# Patient Record
Sex: Male | Born: 1965 | Race: Black or African American | Hispanic: No | Smoking: Current every day smoker
Health system: Southern US, Community
[De-identification: ages and names within clinical notes are randomized; demographics above are authoritative.]

## PROBLEM LIST (undated history)

## (undated) DIAGNOSIS — T7840XA Allergy, unspecified, initial encounter: Secondary | ICD-10-CM

## (undated) DIAGNOSIS — R0602 Shortness of breath: Secondary | ICD-10-CM

## (undated) DIAGNOSIS — E119 Type 2 diabetes mellitus without complications: Secondary | ICD-10-CM

## (undated) DIAGNOSIS — G4730 Sleep apnea, unspecified: Secondary | ICD-10-CM

## (undated) DIAGNOSIS — M1990 Unspecified osteoarthritis, unspecified site: Secondary | ICD-10-CM

## (undated) DIAGNOSIS — E785 Hyperlipidemia, unspecified: Secondary | ICD-10-CM

## (undated) DIAGNOSIS — I10 Essential (primary) hypertension: Secondary | ICD-10-CM

## (undated) DIAGNOSIS — H538 Other visual disturbances: Secondary | ICD-10-CM

## (undated) DIAGNOSIS — N179 Acute kidney failure, unspecified: Secondary | ICD-10-CM

## (undated) HISTORY — DX: Unspecified osteoarthritis, unspecified site: M19.90

## (undated) HISTORY — DX: Hyperlipidemia, unspecified: E78.5

## (undated) HISTORY — DX: Other visual disturbances: H53.8

## (undated) HISTORY — DX: Shortness of breath: R06.02

## (undated) HISTORY — DX: Acute kidney failure, unspecified: N17.9

## (undated) HISTORY — DX: Sleep apnea, unspecified: G47.30

## (undated) HISTORY — DX: Allergy, unspecified, initial encounter: T78.40XA

## (undated) HISTORY — DX: Type 2 diabetes mellitus without complications: E11.9

---

## 2010-06-08 ENCOUNTER — Other Ambulatory Visit: Payer: Self-pay | Admitting: Cardiology

## 2010-06-08 ENCOUNTER — Ambulatory Visit
Admission: RE | Admit: 2010-06-08 | Discharge: 2010-06-08 | Disposition: A | Discharge status: Home / Self Care | Payer: Self-pay | Source: Ambulatory Visit | Attending: Cardiology | Admitting: Cardiology

## 2010-06-08 DIAGNOSIS — I10 Essential (primary) hypertension: Secondary | ICD-10-CM

## 2013-09-22 ENCOUNTER — Other Ambulatory Visit (HOSPITAL_COMMUNITY): Payer: Self-pay | Admitting: Cardiology

## 2013-09-22 DIAGNOSIS — R079 Chest pain, unspecified: Secondary | ICD-10-CM

## 2013-09-25 ENCOUNTER — Other Ambulatory Visit: Payer: Self-pay | Admitting: Cardiology

## 2013-09-25 ENCOUNTER — Encounter (INDEPENDENT_AMBULATORY_CARE_PROVIDER_SITE_OTHER): Payer: Self-pay

## 2013-09-25 ENCOUNTER — Ambulatory Visit
Admission: RE | Admit: 2013-09-25 | Discharge: 2013-09-25 | Disposition: A | Discharge status: Home / Self Care | Payer: BC Managed Care – PPO | Source: Ambulatory Visit | Attending: Cardiology | Admitting: Cardiology

## 2013-09-25 DIAGNOSIS — M545 Low back pain, unspecified: Secondary | ICD-10-CM

## 2013-10-05 ENCOUNTER — Encounter (HOSPITAL_COMMUNITY)
Admission: RE | Admit: 2013-10-05 | Discharge: 2013-10-05 | Disposition: A | Discharge status: Home / Self Care | Payer: BC Managed Care – PPO | Source: Ambulatory Visit | Attending: Cardiology | Admitting: Cardiology

## 2013-10-05 ENCOUNTER — Other Ambulatory Visit: Payer: Self-pay

## 2013-10-05 DIAGNOSIS — R079 Chest pain, unspecified: Secondary | ICD-10-CM | POA: Insufficient documentation

## 2013-10-05 MED ORDER — TECHNETIUM TC 99M SESTAMIBI - CARDIOLITE: 30.0000 | Freq: Once | INTRAVENOUS | Status: AC | PRN

## 2013-10-05 MED ORDER — TECHNETIUM TC 99M SESTAMIBI GENERIC - CARDIOLITE
30.0000 | Freq: Once | INTRAVENOUS | Status: AC | PRN
  Administered 2013-10-05: 30 via INTRAVENOUS

## 2013-10-05 MED ORDER — TECHNETIUM TC 99M SESTAMIBI GENERIC - CARDIOLITE
10.0000 | Freq: Once | INTRAVENOUS | Status: AC | PRN
  Administered 2013-10-05: 10 via INTRAVENOUS

## 2013-10-07 ENCOUNTER — Ambulatory Visit (HOSPITAL_COMMUNITY): Payer: BC Managed Care – PPO

## 2013-10-07 ENCOUNTER — Encounter (HOSPITAL_COMMUNITY): Payer: BC Managed Care – PPO

## 2014-11-11 ENCOUNTER — Encounter (HOSPITAL_COMMUNITY): Payer: Self-pay | Admitting: Emergency Medicine

## 2014-11-11 ENCOUNTER — Emergency Department (HOSPITAL_COMMUNITY)
Admission: EM | Admit: 2014-11-11 | Discharge: 2014-11-11 | Disposition: A | Discharge status: Home / Self Care | Payer: BC Managed Care – PPO | Attending: Emergency Medicine | Admitting: Emergency Medicine

## 2014-11-11 DIAGNOSIS — Z720 Tobacco use: Secondary | ICD-10-CM | POA: Diagnosis not present

## 2014-11-11 DIAGNOSIS — Y998 Other external cause status: Secondary | ICD-10-CM | POA: Insufficient documentation

## 2014-11-11 DIAGNOSIS — Y9289 Other specified places as the place of occurrence of the external cause: Secondary | ICD-10-CM | POA: Insufficient documentation

## 2014-11-11 DIAGNOSIS — Y9389 Activity, other specified: Secondary | ICD-10-CM | POA: Insufficient documentation

## 2014-11-11 DIAGNOSIS — T63461A Toxic effect of venom of wasps, accidental (unintentional), initial encounter: Secondary | ICD-10-CM | POA: Diagnosis present

## 2014-11-11 MED ORDER — DIPHENHYDRAMINE HCL 25 MG PO TABS: 50.0000 mg | ORAL_TABLET | Freq: Four times a day (QID) | ORAL | Status: DC | PRN

## 2014-11-11 MED ORDER — FAMOTIDINE 20 MG PO TABS: 20.0000 mg | ORAL_TABLET | Freq: Every day | ORAL | Status: DC

## 2014-11-11 MED ORDER — EPINEPHRINE 0.3 MG/0.3ML IJ SOAJ: 0.3000 mg | Freq: Once | INTRAMUSCULAR | Status: DC

## 2014-11-11 MED ORDER — DIPHENHYDRAMINE HCL 25 MG PO CAPS
50.0000 mg | ORAL_CAPSULE | Freq: Once | ORAL | Status: AC
  Administered 2014-11-11: 50 mg via ORAL
  Filled 2014-11-11: qty 2

## 2014-11-11 MED ORDER — FAMOTIDINE 20 MG PO TABS
40.0000 mg | ORAL_TABLET | Freq: Once | ORAL | Status: AC
  Administered 2014-11-11: 40 mg via ORAL
  Filled 2014-11-11: qty 2

## 2014-11-11 MED ORDER — PREDNISONE 20 MG PO TABS: 60.0000 mg | ORAL_TABLET | Freq: Every day | ORAL | Status: DC

## 2014-11-11 MED ORDER — PREDNISONE 20 MG PO TABS
60.0000 mg | ORAL_TABLET | Freq: Once | ORAL | Status: AC
  Administered 2014-11-11: 60 mg via ORAL
  Filled 2014-11-11: qty 3

## 2014-11-11 NOTE — Discharge Instructions (Signed)
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
## 2015-01-13 ENCOUNTER — Emergency Department (HOSPITAL_COMMUNITY): Payer: Worker's Compensation

## 2015-01-13 ENCOUNTER — Emergency Department (HOSPITAL_COMMUNITY)
Admission: EM | Admit: 2015-01-13 | Discharge: 2015-01-13 | Disposition: A | Discharge status: Home / Self Care | Payer: Worker's Compensation | Attending: Emergency Medicine | Admitting: Emergency Medicine

## 2015-01-13 ENCOUNTER — Encounter (HOSPITAL_COMMUNITY): Payer: Self-pay | Admitting: *Deleted

## 2015-01-13 DIAGNOSIS — Y9389 Activity, other specified: Secondary | ICD-10-CM | POA: Insufficient documentation

## 2015-01-13 DIAGNOSIS — Y998 Other external cause status: Secondary | ICD-10-CM | POA: Insufficient documentation

## 2015-01-13 DIAGNOSIS — S99922A Unspecified injury of left foot, initial encounter: Secondary | ICD-10-CM | POA: Insufficient documentation

## 2015-01-13 DIAGNOSIS — Y92219 Unspecified school as the place of occurrence of the external cause: Secondary | ICD-10-CM | POA: Insufficient documentation

## 2015-01-13 DIAGNOSIS — Z720 Tobacco use: Secondary | ICD-10-CM | POA: Diagnosis not present

## 2015-01-13 DIAGNOSIS — Z7952 Long term (current) use of systemic steroids: Secondary | ICD-10-CM | POA: Insufficient documentation

## 2015-01-13 MED ORDER — NAPROXEN 500 MG PO TABS: 500.0000 mg | ORAL_TABLET | Freq: Two times a day (BID) | ORAL | Status: DC

## 2015-01-13 MED ORDER — ELVITEG-COBIC-EMTRICIT-TENOFAF 150-150-200-10 MG PO TABS: 1.0000 | ORAL_TABLET | Freq: Every day | ORAL | Status: DC

## 2015-01-13 MED ORDER — HYDROCODONE-ACETAMINOPHEN 5-325 MG PO TABS
2.0000 | ORAL_TABLET | Freq: Once | ORAL | Status: AC
  Administered 2015-01-13: 2 via ORAL
  Filled 2015-01-13: qty 2

## 2015-01-13 NOTE — ED Notes (Signed)
Pt in c/o injury to his left foot, states it happened at work today around 10 am, increased pain and swelling since that time

## 2015-01-13 NOTE — ED Notes (Signed)
Patient transported to X-ray 
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
## 2015-01-13 NOTE — Discharge Instructions (Signed)
Rest. Ice. Compress. Elevate. Follow-up with [facility] in one week if no improvement. Return for numbness or tingling in the foot. Use crutches and ankle splint until you no longer have pain.

## 2015-01-31 ENCOUNTER — Encounter (HOSPITAL_COMMUNITY): Payer: Self-pay | Admitting: Emergency Medicine

## 2015-01-31 ENCOUNTER — Emergency Department (HOSPITAL_COMMUNITY)
Admission: EM | Admit: 2015-01-31 | Discharge: 2015-01-31 | Disposition: A | Discharge status: Home / Self Care | Payer: BC Managed Care – PPO | Attending: Emergency Medicine | Admitting: Emergency Medicine

## 2015-01-31 DIAGNOSIS — Z720 Tobacco use: Secondary | ICD-10-CM | POA: Diagnosis not present

## 2015-01-31 DIAGNOSIS — M5441 Lumbago with sciatica, right side: Secondary | ICD-10-CM | POA: Diagnosis not present

## 2015-01-31 DIAGNOSIS — Z791 Long term (current) use of non-steroidal anti-inflammatories (NSAID): Secondary | ICD-10-CM | POA: Insufficient documentation

## 2015-01-31 DIAGNOSIS — Z7952 Long term (current) use of systemic steroids: Secondary | ICD-10-CM | POA: Insufficient documentation

## 2015-01-31 DIAGNOSIS — Z79899 Other long term (current) drug therapy: Secondary | ICD-10-CM | POA: Insufficient documentation

## 2015-01-31 DIAGNOSIS — M545 Low back pain: Secondary | ICD-10-CM | POA: Diagnosis present

## 2015-01-31 MED ORDER — NAPROXEN 500 MG PO TABS: 500.0000 mg | ORAL_TABLET | Freq: Two times a day (BID) | ORAL | Status: DC

## 2015-01-31 MED ORDER — TRAMADOL HCL 50 MG PO TABS: 50.0000 mg | ORAL_TABLET | Freq: Four times a day (QID) | ORAL | Status: DC | PRN

## 2015-01-31 MED ORDER — METHOCARBAMOL 500 MG PO TABS: 500.0000 mg | ORAL_TABLET | Freq: Two times a day (BID) | ORAL | Status: DC

## 2015-01-31 NOTE — Discharge Instructions (Signed)
Take pain medication (Ultram) and muscle relaxer (Robaxin) as prescribed for severe pain.  Do not drive or operate heavy machinery for 4-6 hours after taking medication.
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
## 2015-01-31 NOTE — ED Notes (Signed)
Pt sts intermittent lower back with radiation down right

## 2015-02-11 ENCOUNTER — Inpatient Hospital Stay: Payer: Self-pay | Admitting: Family Medicine

## 2015-08-02 IMAGING — CR DG LUMBAR SPINE COMPLETE 4+V
5 series · 5 of 5 positions shown · non-contrast
Comparison: Chest radiographs 06/08/2010.

CLINICAL DATA: 47-year-old male low back pain radiating to the
right lower extremity. Initial encounter.

EXAM:
LUMBAR SPINE - COMPLETE 4+ VIEW

[t l-spine a.p.]
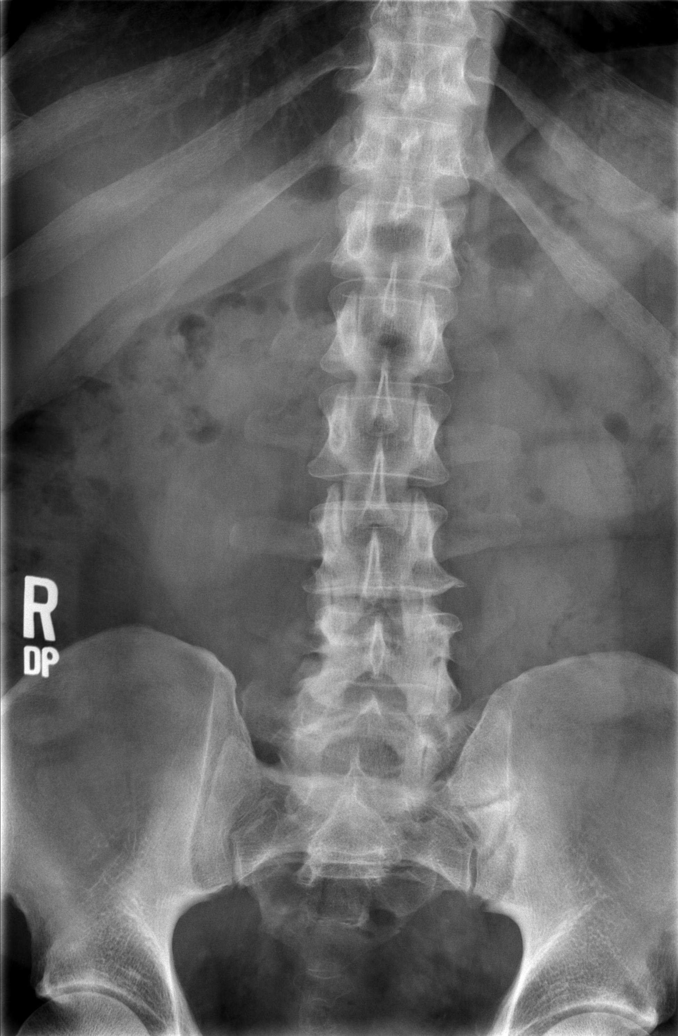

[t l-spine oblique exposure (1 of 2)]
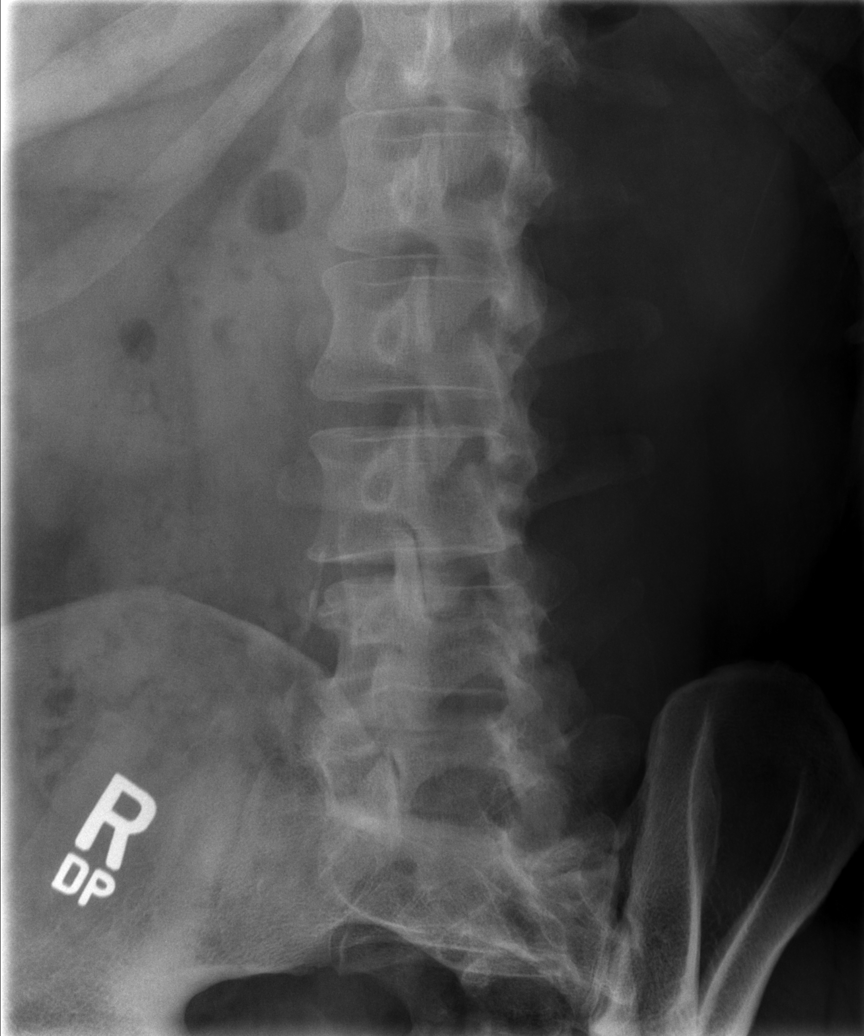

[t l-spine oblique exposure (2 of 2)]
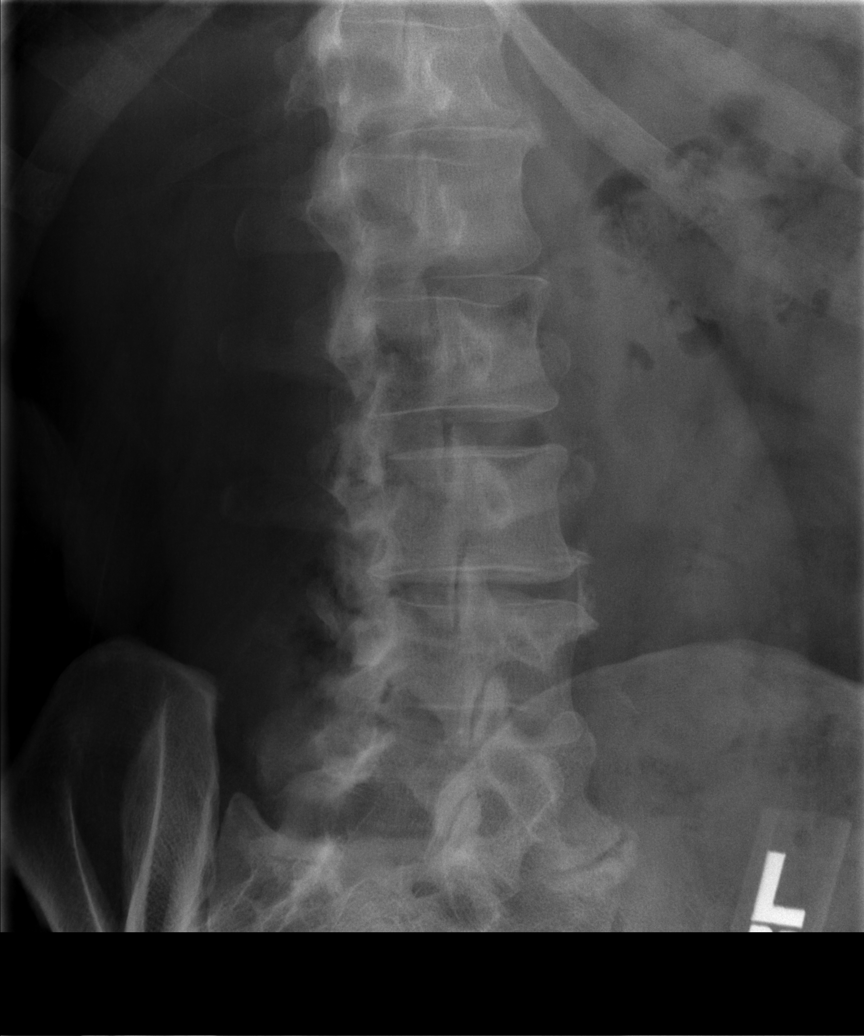

[t l-spine lat]
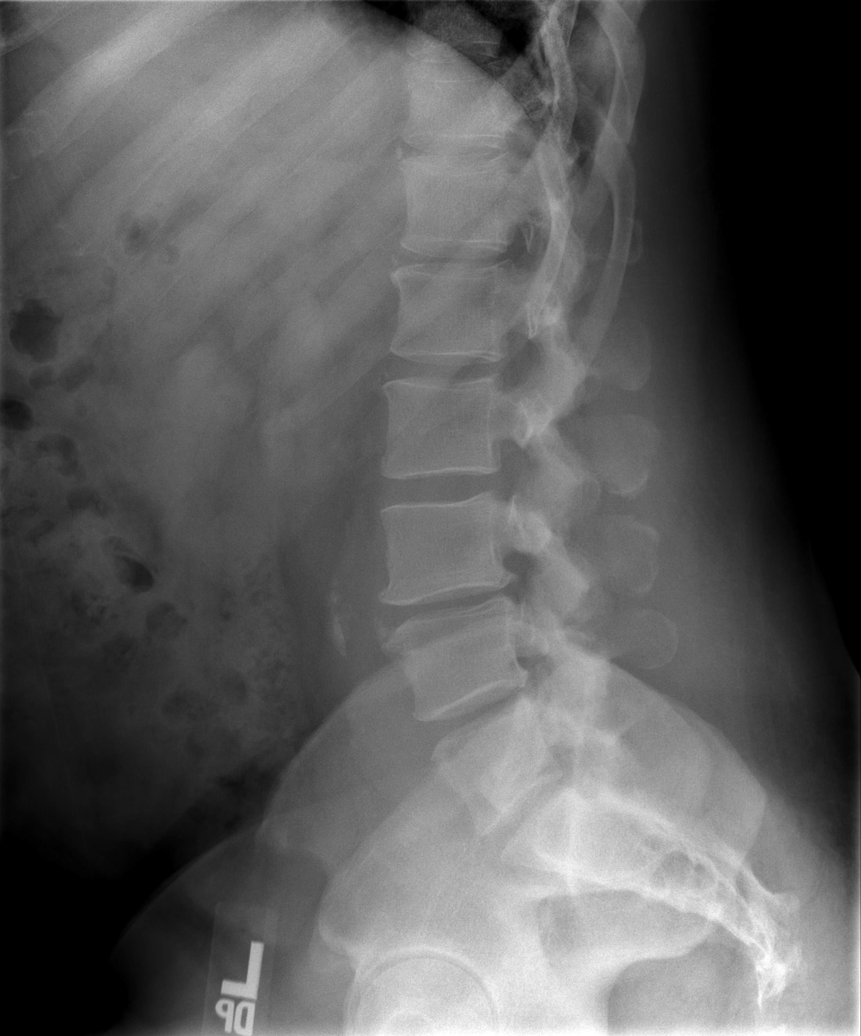

[t l-spine l5-s1 spot]
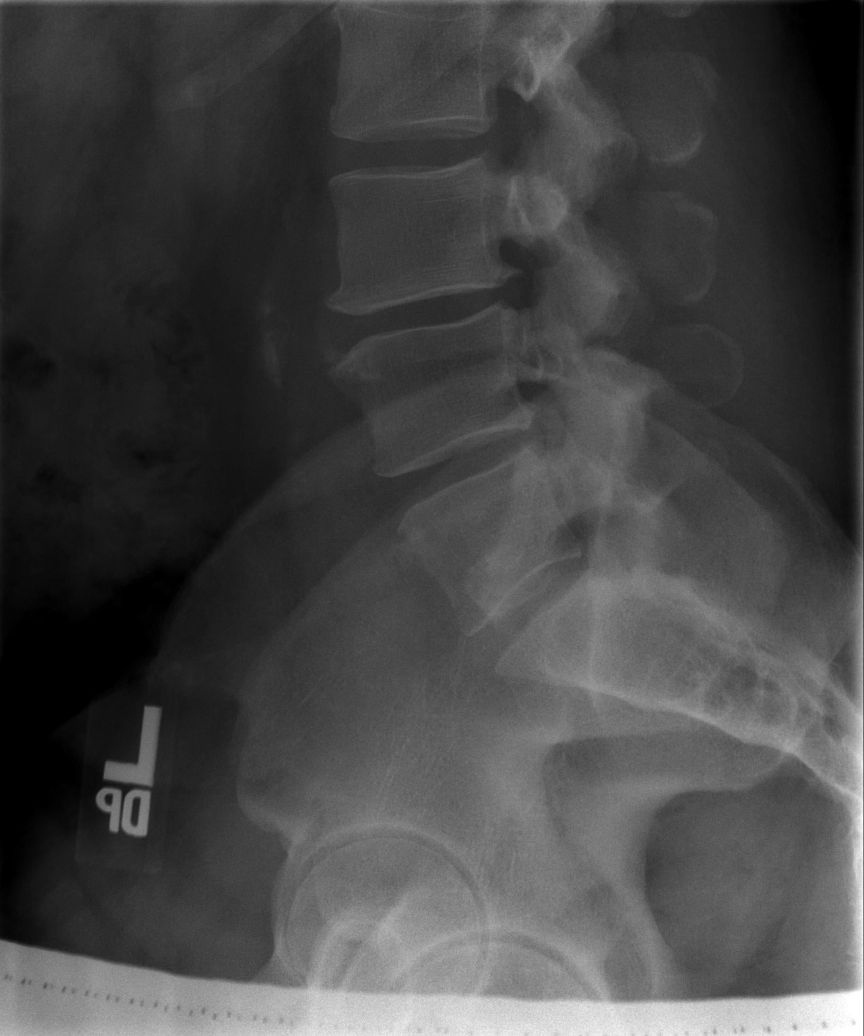

[5 of 5 positions shown; findings below may reference images not displayed]

FINDINGS: Twelve full size pairs of ribs on the comparison. Therefore, normal
lumbar segmentation except for vestigial ribs at L1, and with a
lumbarized S1 level. Sclerotic left S1-S2 assimilation joint. Normal
vertebral height and alignment. Disc space loss and endplate
spurring at L4-L5, and to a lesser extent L5-S1. Mild facet
hypertrophy bilaterally at each of these levels. Other disc spaces
are relatively preserved. Mild Aortoiliac calcified atherosclerosis
noted. Visualized lower thoracic levels appear intact. Sacral ala
and SI joints within normal limits.
IMPRESSION: 1. Transitional anatomy, with lumbarized S1 level.
2. Chronic disc, endplate, and facet degeneration at L4-L5 and
L5-S1. Mildly sclerotic left S1-S2 assimilation joint.

## 2016-07-09 ENCOUNTER — Encounter (HOSPITAL_COMMUNITY): Payer: Self-pay | Admitting: Emergency Medicine

## 2016-07-09 ENCOUNTER — Emergency Department (HOSPITAL_COMMUNITY): Payer: BC Managed Care – PPO

## 2016-07-09 ENCOUNTER — Observation Stay (HOSPITAL_COMMUNITY)
Admission: EM | Admit: 2016-07-09 | Discharge: 2016-07-10 | Disposition: A | Discharge status: Home / Self Care | Payer: BC Managed Care – PPO | Attending: Nephrology | Admitting: Nephrology

## 2016-07-09 DIAGNOSIS — F17200 Nicotine dependence, unspecified, uncomplicated: Secondary | ICD-10-CM | POA: Diagnosis not present

## 2016-07-09 DIAGNOSIS — E871 Hypo-osmolality and hyponatremia: Secondary | ICD-10-CM | POA: Diagnosis not present

## 2016-07-09 DIAGNOSIS — H538 Other visual disturbances: Secondary | ICD-10-CM

## 2016-07-09 DIAGNOSIS — Z791 Long term (current) use of non-steroidal anti-inflammatories (NSAID): Secondary | ICD-10-CM | POA: Diagnosis not present

## 2016-07-09 DIAGNOSIS — I10 Essential (primary) hypertension: Secondary | ICD-10-CM

## 2016-07-09 DIAGNOSIS — R739 Hyperglycemia, unspecified: Secondary | ICD-10-CM

## 2016-07-09 DIAGNOSIS — N179 Acute kidney failure, unspecified: Secondary | ICD-10-CM

## 2016-07-09 DIAGNOSIS — E119 Type 2 diabetes mellitus without complications: Secondary | ICD-10-CM

## 2016-07-09 DIAGNOSIS — Z7952 Long term (current) use of systemic steroids: Secondary | ICD-10-CM | POA: Insufficient documentation

## 2016-07-09 DIAGNOSIS — Z79899 Other long term (current) drug therapy: Secondary | ICD-10-CM | POA: Diagnosis not present

## 2016-07-09 DIAGNOSIS — E1165 Type 2 diabetes mellitus with hyperglycemia: Secondary | ICD-10-CM | POA: Diagnosis not present

## 2016-07-09 DIAGNOSIS — B370 Candidal stomatitis: Secondary | ICD-10-CM

## 2016-07-09 DIAGNOSIS — J029 Acute pharyngitis, unspecified: Secondary | ICD-10-CM | POA: Diagnosis present

## 2016-07-09 DIAGNOSIS — E1159 Type 2 diabetes mellitus with other circulatory complications: Secondary | ICD-10-CM | POA: Insufficient documentation

## 2016-07-09 HISTORY — DX: Acute kidney failure, unspecified: N17.9

## 2016-07-09 HISTORY — DX: Candidal stomatitis: B37.0

## 2016-07-09 HISTORY — DX: Other visual disturbances: H53.8

## 2016-07-09 LAB — CBC WITH DIFFERENTIAL/PLATELET
BASOS ABS: 0 10*3/uL (ref 0.0–0.1)
BASOS PCT: 0 %
EOS ABS: 0.1 10*3/uL (ref 0.0–0.7)
EOS PCT: 1 %
HCT: 44.9 % (ref 39.0–52.0)
Hemoglobin: 15.7 g/dL (ref 13.0–17.0)
Lymphocytes Relative: 22 %
Lymphs Abs: 2.7 10*3/uL (ref 0.7–4.0)
MCH: 29 pg (ref 26.0–34.0)
MCHC: 35 g/dL (ref 30.0–36.0)
MCV: 82.8 fL (ref 78.0–100.0)
Monocytes Absolute: 0.8 10*3/uL (ref 0.1–1.0)
Monocytes Relative: 6 %
Neutro Abs: 8.8 10*3/uL — ABNORMAL HIGH (ref 1.7–7.7)
Neutrophils Relative %: 71 %
PLATELETS: 208 10*3/uL (ref 150–400)
RBC: 5.42 MIL/uL (ref 4.22–5.81)
RDW: 12.4 % (ref 11.5–15.5)
WBC: 12.4 10*3/uL — AB (ref 4.0–10.5)

## 2016-07-09 LAB — URINALYSIS, ROUTINE W REFLEX MICROSCOPIC
Bacteria, UA: NONE SEEN
Bilirubin Urine: NEGATIVE
HGB URINE DIPSTICK: NEGATIVE
Ketones, ur: NEGATIVE mg/dL
Leukocytes, UA: NEGATIVE
Nitrite: NEGATIVE
PROTEIN: NEGATIVE mg/dL
Specific Gravity, Urine: 1.023 (ref 1.005–1.030)
Squamous Epithelial / LPF: NONE SEEN
pH: 7 (ref 5.0–8.0)

## 2016-07-09 LAB — CREATININE, SERUM
Creatinine, Ser: 1.28 mg/dL — ABNORMAL HIGH (ref 0.61–1.24)
GFR calc Af Amer: >60 mL/min (ref >60)

## 2016-07-09 LAB — I-STAT VENOUS BLOOD GAS, ED
ACID-BASE EXCESS: 4 mmol/L — AB (ref 0.0–2.0)
Bicarbonate: 30.2 mmol/L — ABNORMAL HIGH (ref 20.0–28.0)
O2 SAT: 58 %
PO2 VEN: 31 mmHg — AB (ref 32.0–45.0)
TCO2: 32 mmol/L (ref 0–100)
pCO2, Ven: 48 mmHg (ref 44.0–60.0)
pH, Ven: 7.406 (ref 7.250–7.430)

## 2016-07-09 LAB — COMPREHENSIVE METABOLIC PANEL
ALT: 21 U/L (ref 17–63)
AST: 16 U/L (ref 15–41)
Albumin: 3.5 g/dL (ref 3.5–5.0)
Alkaline Phosphatase: 102 U/L (ref 38–126)
Anion gap: 12 (ref 5–15)
BILIRUBIN TOTAL: 0.4 mg/dL (ref 0.3–1.2)
BUN: 14 mg/dL (ref 6–20)
CALCIUM: 9.1 mg/dL (ref 8.9–10.3)
CO2: 25 mmol/L (ref 22–32)
CREATININE: 1.33 mg/dL — AB (ref 0.61–1.24)
Chloride: 94 mmol/L — ABNORMAL LOW (ref 101–111)
GFR calc Af Amer: >60 mL/min (ref >60)
Glucose, Bld: 724 mg/dL (ref 65–99)
Potassium: 4.4 mmol/L (ref 3.5–5.1)
Sodium: 131 mmol/L — ABNORMAL LOW (ref 135–145)
TOTAL PROTEIN: 7.2 g/dL (ref 6.5–8.1)

## 2016-07-09 LAB — CBC
HEMATOCRIT: 44.4 % (ref 39.0–52.0)
Hemoglobin: 15.4 g/dL (ref 13.0–17.0)
MCH: 28.5 pg (ref 26.0–34.0)
MCHC: 34.7 g/dL (ref 30.0–36.0)
MCV: 82.2 fL (ref 78.0–100.0)
Platelets: 198 10*3/uL (ref 150–400)
RBC: 5.4 MIL/uL (ref 4.22–5.81)
RDW: 12.5 % (ref 11.5–15.5)
WBC: 12.6 10*3/uL — ABNORMAL HIGH (ref 4.0–10.5)

## 2016-07-09 LAB — RAPID STREP SCREEN (MED CTR MEBANE ONLY): Streptococcus, Group A Screen (Direct): NEGATIVE

## 2016-07-09 LAB — CBG MONITORING, ED
GLUCOSE-CAPILLARY: 539 mg/dL — AB (ref 65–99)
Glucose-Capillary: >600 mg/dL (ref 65–99)

## 2016-07-09 LAB — GLUCOSE, CAPILLARY
Glucose-Capillary: 384 mg/dL — ABNORMAL HIGH (ref 65–99)
Glucose-Capillary: 418 mg/dL — ABNORMAL HIGH (ref 65–99)

## 2016-07-09 MED ORDER — HEPARIN SODIUM (PORCINE) 5000 UNIT/ML IJ SOLN
5000.0000 [IU] | Freq: Three times a day (TID) | INTRAMUSCULAR | Status: DC
  Administered 2016-07-09 – 2016-07-10 (×2): 5000 [IU] via SUBCUTANEOUS
  Filled 2016-07-09 (×2): qty 1

## 2016-07-09 MED ORDER — INSULIN ASPART 100 UNIT/ML ~~LOC~~ SOLN
0.0000 [IU] | SUBCUTANEOUS | Status: DC
  Administered 2016-07-10 (×2): 3 [IU] via SUBCUTANEOUS
  Administered 2016-07-10: 5 [IU] via SUBCUTANEOUS
  Administered 2016-07-10: 9 [IU] via SUBCUTANEOUS

## 2016-07-09 MED ORDER — HYDRALAZINE HCL 20 MG/ML IJ SOLN: 5.0000 mg | INTRAMUSCULAR | Status: DC | PRN

## 2016-07-09 MED ORDER — HYDROCODONE-ACETAMINOPHEN 5-325 MG PO TABS: 1.0000 | ORAL_TABLET | ORAL | Status: DC | PRN

## 2016-07-09 MED ORDER — POLYVINYL ALCOHOL 1.4 % OP SOLN
2.0000 [drp] | Freq: Three times a day (TID) | OPHTHALMIC | Status: DC
  Administered 2016-07-09 – 2016-07-10 (×2): 2 [drp] via OPHTHALMIC
  Filled 2016-07-09: qty 15

## 2016-07-09 MED ORDER — SODIUM CHLORIDE 0.9 % IV BOLUS (SEPSIS)
1000.0000 mL | Freq: Once | INTRAVENOUS | Status: AC
  Administered 2016-07-09: 1000 mL via INTRAVENOUS

## 2016-07-09 MED ORDER — FLUCONAZOLE 100 MG PO TABS
150.0000 mg | ORAL_TABLET | Freq: Once | ORAL | Status: AC
  Administered 2016-07-09: 150 mg via ORAL
  Filled 2016-07-09: qty 2

## 2016-07-09 MED ORDER — MAGIC MOUTHWASH W/LIDOCAINE
5.0000 mL | Freq: Three times a day (TID) | ORAL | Status: DC
  Administered 2016-07-09 – 2016-07-10 (×3): 5 mL via ORAL
  Filled 2016-07-09 (×3): qty 5

## 2016-07-09 MED ORDER — TRAZODONE HCL 50 MG PO TABS: 50.0000 mg | ORAL_TABLET | Freq: Every evening | ORAL | Status: DC | PRN

## 2016-07-09 MED ORDER — ACETAMINOPHEN 325 MG PO TABS
650.0000 mg | ORAL_TABLET | Freq: Four times a day (QID) | ORAL | Status: DC | PRN
  Administered 2016-07-10: 650 mg via ORAL
  Filled 2016-07-09: qty 2

## 2016-07-09 MED ORDER — ONDANSETRON HCL 4 MG PO TABS: 4.0000 mg | ORAL_TABLET | Freq: Four times a day (QID) | ORAL | Status: DC | PRN

## 2016-07-09 MED ORDER — ASPIRIN EC 81 MG PO TBEC
81.0000 mg | DELAYED_RELEASE_TABLET | Freq: Every day | ORAL | Status: DC
  Administered 2016-07-09 – 2016-07-10 (×2): 81 mg via ORAL
  Filled 2016-07-09 (×2): qty 1

## 2016-07-09 MED ORDER — INSULIN ASPART 100 UNIT/ML ~~LOC~~ SOLN: 0.0000 [IU] | Freq: Three times a day (TID) | SUBCUTANEOUS | Status: DC

## 2016-07-09 MED ORDER — SODIUM CHLORIDE 0.9 % IV SOLN
INTRAVENOUS | Status: AC
Start: 2016-07-09 — End: 2016-07-10
  Administered 2016-07-09: 1000 mL via INTRAVENOUS
  Administered 2016-07-10: 04:00:00 via INTRAVENOUS

## 2016-07-09 MED ORDER — INSULIN ASPART 100 UNIT/ML ~~LOC~~ SOLN
12.0000 [IU] | Freq: Once | SUBCUTANEOUS | Status: AC
  Administered 2016-07-09: 12 [IU] via SUBCUTANEOUS

## 2016-07-09 MED ORDER — LIVING WELL WITH DIABETES BOOK
Freq: Once | Status: AC
  Administered 2016-07-09: 21:00:00
  Filled 2016-07-09: qty 1

## 2016-07-09 MED ORDER — INSULIN ASPART 100 UNIT/ML ~~LOC~~ SOLN
10.0000 [IU] | Freq: Once | SUBCUTANEOUS | Status: AC
  Administered 2016-07-09: 10 [IU] via INTRAVENOUS
  Filled 2016-07-09: qty 1

## 2016-07-09 MED ORDER — ONDANSETRON HCL 4 MG/2ML IJ SOLN: 4.0000 mg | Freq: Four times a day (QID) | INTRAMUSCULAR | Status: DC | PRN

## 2016-07-09 MED ORDER — POLYETHYLENE GLYCOL 3350 17 G PO PACK: 17.0000 g | PACK | Freq: Every day | ORAL | Status: DC | PRN

## 2016-07-09 MED ORDER — ACETAMINOPHEN 650 MG RE SUPP: 650.0000 mg | Freq: Four times a day (QID) | RECTAL | Status: DC | PRN

## 2016-07-09 NOTE — ED Notes (Signed)
Checked patient cbg it read over 600 notified PA of blood sugar

## 2016-07-09 NOTE — ED Notes (Signed)
Checked patient eyes 20/20 both eyes and left eye 20/160 and right patient was 20/80

## 2016-07-09 NOTE — H&P (Addendum)
History and Physical    Marco Campbell QIH:[id number] DOB: Apr 05, 1966 DOA: 07/09/2016  PCP: Patricia Nettle, MD   Patient coming from: home  Chief Complaint: sore throat, HA, blurry vision   HPI: Marco Campbell is a 51 y.o. male with no significant previous medical history who presented to the ED with c/o sore throat, HA, blurry vision that has been going on for almost two weeks Patient reports that his last physical exam was years ago and he considers himself healthy, never been diagnosed with any illnesses. During the last (been having polydipsia and polyuria and the rest of the symptoms he developed over the period of last couple of weeks but blurry lesion was present for a while intermittently  ED Course: On admission in the emergency room his vital signs initially revealed tachycardia with heart rate 119 bpm, and elevated later to 163/86, needed further refills count-12,400, glucose was elevated to 724 mg/dL, creatinine 1.33, sodium 131 Chest x-ray didn't reveal any active cardiopulmonary disease  Review of Systems: As per HPI otherwise 10 point review of systems negative.   Ambulatory Status: History reviewed. No pertinent past medical history.  History reviewed. No pertinent surgical history.  Social History   Social History  . Marital status: Legally Separated    Spouse name: N/A  . Number of children: N/A  . Years of education: N/A   Occupational History  . Not on file.   Social History Main Topics  . Smoking status: Current Every Day Smoker  . Smokeless tobacco: Not on file  . Alcohol use Yes     Comment: occasionally  . Drug use: No  . Sexual activity: Not on file   Other Topics Concern  . Not on file   Social History Narrative  . No narrative on file    Allergies  Allergen Reactions  . Bee Venom Hives    Family History  Problem Relation Age of Onset  . CAD Mother   . Diabetes Mother   . Heart failure Mother   . Diabetes Father   .  Hypertension Brother   . Diabetes Maternal Grandmother     Prior to Admission medications   Medication Sig Start Date End Date Taking? Authorizing Provider  diphenhydrAMINE (BENADRYL) 25 MG tablet Take 2 tablets (50 mg total) by mouth every 6 (six) hours as needed. 11/11/14   Kristen N Ward, DO  EPINEPHrine 0.3 mg/0.3 mL IJ SOAJ injection Inject 0.3 mLs (0.3 mg total) into the muscle once. 11/11/14   Kristen N Ward, DO  famotidine (PEPCID) 20 MG tablet Take 1 tablet (20 mg total) by mouth daily. 11/11/14   Kristen N Ward, DO  methocarbamol (ROBAXIN) 500 MG tablet Take 1 tablet (500 mg total) by mouth 2 (two) times daily. 01/31/15   Heather Laisure, PA-C  naproxen (NAPROSYN) 500 MG tablet Take 1 tablet (500 mg total) by mouth 2 (two) times daily. 01/31/15   Heather Laisure, PA-C  predniSONE (DELTASONE) 20 MG tablet Take 3 tablets (60 mg total) by mouth daily. 11/11/14   Kristen N Ward, DO  traMADol (ULTRAM) 50 MG tablet Take 1 tablet (50 mg total) by mouth every 6 (six) hours as needed. 01/31/15   Hyman Bible, PA-C    Physical Exam: Vitals:   07/09/16 1515 07/09/16 1558 07/09/16 1600 07/09/16 1700  BP: 140/84 (!) 151/66 138/81 (!) 131/59  Pulse: 83 82 80 70  Resp: 20 20  18   Temp:      TempSrc:  SpO2: 99% 98% 98% 92%  Weight:      Height:         General: Appears calm and comfortable Eyes: PERRLA, EOMI, normal lids, iris ENT:  grossly normal hearing, lips & tongue, mucous membranes moist and intact Neck: no lymphoadenopathy, masses or thyromegaly Cardiovascular: RRR, no m/r/g. No JVD, carotid bruits. No LE edema.  Respiratory: bilateral no wheezes, rales, rhonchi or cracles. Normal respiratory effort. No accessory muscle use observed Abdomen: soft, non-tender, non-distended, no organomegaly or masses appreciated. BS present in all quadrants Skin: no rash, ulcers or induration seen on limited exam Musculoskeletal: grossly normal tone BUE/BLE, good ROM, no bony abnormality or  joint deformities observed Psychiatric: grossly normal mood and affect, speech fluent and appropriate, alert and oriented x3 Neurologic: CN II-XII grossly intact, moves all extremities in coordinated fashion, sensation intact  Labs on Admission: I have personally reviewed following labs and imaging studies  CBC, BMP  GFR: Estimated Creatinine Clearance: 91.1 mL/min (A) (by C-G formula based on SCr of 1.33 mg/dL (H)).  Creatinine Clearance: Estimated Creatinine Clearance: 91.1 mL/min (A) (by C-G formula based on SCr of 1.33 mg/dL (H)).  Radiological Exams on Admission: Dg Chest 2 View  Result Date: 07/09/2016 CLINICAL DATA:  Cough. EXAM: CHEST  2 VIEW COMPARISON:  Radiographs of June 08, 2010. FINDINGS: The heart size and mediastinal contours are within normal limits. Both lungs are clear. No pneumothorax or pleural effusion is noted. The visualized skeletal structures are unremarkable. IMPRESSION: No active cardiopulmonary disease. Electronically Signed   By: Marijo Conception, M.D.   On: 07/09/2016 14:18    EKG: Not found   Assessment/Plan Active Problems:   Diabetes mellitus, new onset ([place])   Oral thrush   Blurry vision   AKI (acute kidney injury) ([employer])  New-onset diabetes mellitus Patient presented to the ED with glucose level of 724 mg/dL, he was given 10 units IV NovoLog that reduced glucose 539 mg/dL He didn't have any anion gap, it was normal-12, there was no metabolic acidosis-pH was normal 7.406 Patient was placed on sliding scale insulin, continue to monitor for CBGs every before meals and at bedtime If renal function improves may consider Glucophage prior to discharge Initiate ASA 81 mg for primary ACS prevention given DM and family h/o CAD  AKI - most likely associated with dehydration secondary to hyperosmolar diuresis this patient very frequent urination Continue IV fluids and follow renal indices Consider starting ACE inhibitor for renovascular protection  given his blood pressure remains elevated  Oral thrush - apparently associated with diabetes and a cause of the sore throat Will order nystatin suspension swish and swallow, diflucan 150 mg once Continue hydration  Blurry vision - intermittent problem Could be associated with diabetic retinopathy and oriented dry syndrome Follow-up with ophthalmologist was highly recommended Initiate artificial teardrops  HTN: no h/o of the same. May need antihypertensive at time of discharge if remains elevated. Consider HCTZ or Lisinopril - Hydralazine prn  DVT prophylaxis: heparin Code Status: full Family Communication: none Disposition Plan: Medsurg Consults called: none Admission status: observationi    York Grice, PA-C Pager: [phone] [facility]  If 7PM-7AM, please contact night-coverage [email or web address] Password [email or web address]  07/09/2016, 5:34 PM     Attending MD note  Patient was seen, examined,treatment plan was discussed with the  Advance Practice Provider.  I have personally reviewed the clinical findings, lab,EKG, imaging studies and management of this patient in detail.I have also reviewed the orders written for this patient which  were under my direction. I agree with the documentation, as recorded by the Advance Practice Provider.   Marco Campbell is a 51 y.o. male who presents with New onset DM w/ hyperglycemia w/o DKA but systemic symptoms such as blurry vision and possible thrush. Will also order HIV.     Leslye Puccini, [place], MD Family Medicine See Amion for pager # [facility]

## 2016-07-09 NOTE — ED Notes (Signed)
Pt provided with water, Dr Barbaraann Faster, admitting at bedside

## 2016-07-09 NOTE — ED Notes (Signed)
Abnormal VBG result reported to Dr. Billy Fischer.

## 2016-07-09 NOTE — Progress Notes (Signed)
Report received from [facility], ED-RN, will await for pt. To arrive to room [phone].  Alphonzo Lemmings, RN

## 2016-07-09 NOTE — ED Notes (Signed)
Pt provided with meal tray. Nad at this time.

## 2016-07-09 NOTE — ED Notes (Signed)
Attempted report x1. 

## 2016-07-09 NOTE — ED Notes (Signed)
Transported to xray 

## 2016-07-09 NOTE — ED Notes (Signed)
Returned from xray

## 2016-07-09 NOTE — ED Provider Notes (Signed)
[facility] DEPT Provider Note   CSN: [id number] Arrival date & time: 07/09/16  1002     History   Chief Complaint Chief Complaint  Patient presents with  . Sore Throat  . Generalized Body Aches    HPI Marco Campbell is a 51 y.o. male.  HPI 51 year-old [place] American male with no significant past medical history presents to the ED today with multiple complaints. Patient complains of URI symptoms to include sore throat, tongue pain, rhinorrhea, nonproductive cough ongoing for the past 1-2 weeks. Has not tried any medication at home for symptoms. Patient also states that he feels dehydrated has been having increased in thirst and urination over the past 1-2 weeks. Denies any sick contacts. The patient complains of intermittent sinus headaches that self resolved. Headaches are not maximal or sudden in onset. Patient also endorses intermittent blurry vision over the past 3 days. States that his vision is worse far away. The blurry vision is intermittent and resolved on its own. Not currently having any blurry vision. Denies any double vision or auras. Denies any extremity weakness or facial droop. Patient denies any history of cardiac disease, family history of cardiac disease, history of CVA, hypertension, diabetes. He denies any fever, chills, chest pain, shortness breath, lightheadedness, dizziness, abdominal pain, nausea, emesis, urinary symptoms, change in bowel habits, numbness/tingling. History reviewed. No pertinent past medical history.  There are no active problems to display for this patient.   History reviewed. No pertinent surgical history.     Home Medications    Prior to Admission medications   Medication Sig Start Date End Date Taking? Authorizing Provider  diphenhydrAMINE (BENADRYL) 25 MG tablet Take 2 tablets (50 mg total) by mouth every 6 (six) hours as needed. 11/11/14   Kristen N Ward, DO  EPINEPHrine 0.3 mg/0.3 mL IJ SOAJ injection Inject 0.3 mLs (0.3 mg  total) into the muscle once. 11/11/14   Kristen N Ward, DO  famotidine (PEPCID) 20 MG tablet Take 1 tablet (20 mg total) by mouth daily. 11/11/14   Kristen N Ward, DO  methocarbamol (ROBAXIN) 500 MG tablet Take 1 tablet (500 mg total) by mouth 2 (two) times daily. 01/31/15   Heather Laisure, PA-C  naproxen (NAPROSYN) 500 MG tablet Take 1 tablet (500 mg total) by mouth 2 (two) times daily. 01/31/15   Heather Laisure, PA-C  predniSONE (DELTASONE) 20 MG tablet Take 3 tablets (60 mg total) by mouth daily. 11/11/14   Kristen N Ward, DO  traMADol (ULTRAM) 50 MG tablet Take 1 tablet (50 mg total) by mouth every 6 (six) hours as needed. 01/31/15   Hyman Bible, PA-C    Family History History reviewed. No pertinent family history.  Social History Social History  Substance Use Topics  . Smoking status: Current Every Day Smoker  . Smokeless tobacco: Not on file  . Alcohol use Yes     Comment: occasionally     Allergies   Bee venom   Review of Systems Review of Systems  Constitutional: Negative for chills and fever.  HENT: Positive for congestion, mouth sores, postnasal drip, rhinorrhea, sinus pain, sinus pressure and sore throat. Negative for sneezing and trouble swallowing.   Eyes: Positive for visual disturbance (blurry vision).  Respiratory: Negative for cough, shortness of breath and wheezing.   Cardiovascular: Negative for chest pain and palpitations.  Gastrointestinal: Negative for abdominal pain, diarrhea, nausea and vomiting.  Genitourinary: Negative for dysuria, flank pain, frequency, hematuria and urgency.  Skin: Negative.   Neurological: Positive  for headaches (sinus headache). Negative for dizziness, syncope, weakness, light-headedness and numbness.  All other systems reviewed and are negative.    Physical Exam Updated Vital Signs BP (!) 146/86 (BP Location: Left Arm)   Pulse 90   Temp 98.6 F (37 C) (Oral)   Resp 20   Ht 6\' 1"  (1.854 m)   Wt 122.5 kg   SpO2 97%    BMI 35.62 kg/m   Physical Exam  Constitutional: He is oriented to person, place, and time. He appears well-developed and well-nourished. No distress.  Patient is nontoxic appearing. Resting comfortable in bed in no acute distress.  HENT:  Head: Normocephalic and atraumatic.  Right Ear: Tympanic membrane, external ear and ear canal normal.  Left Ear: Tympanic membrane, external ear and ear canal normal.  Nose: Mucosal edema and rhinorrhea present. Right sinus exhibits no maxillary sinus tenderness and no frontal sinus tenderness. Left sinus exhibits no maxillary sinus tenderness and no frontal sinus tenderness.  Mouth/Throat: Uvula is midline. Mucous membranes are dry. Oral lesions (yellow plaque on tongue) present. Posterior oropharyngeal edema and posterior oropharyngeal erythema present. No oropharyngeal exudate or tonsillar abscesses. No tonsillar exudate.  Eyes: Conjunctivae and EOM are normal. Pupils are equal, round, and reactive to light. Right eye exhibits no discharge. Left eye exhibits no discharge. No scleral icterus.  Neck: Normal range of motion. Neck supple. No thyromegaly present.  No nuchal rigidity   Cardiovascular: Normal rate, regular rhythm, normal heart sounds and intact distal pulses.  Exam reveals no gallop and no friction rub.   No murmur heard. Pulmonary/Chest: Effort normal. No respiratory distress. He has no wheezes. He exhibits no tenderness.  Course sound noted throughout   Abdominal: Soft. Bowel sounds are normal. He exhibits no distension. There is no tenderness. There is no rigidity, no rebound, no guarding and no CVA tenderness.  Musculoskeletal: Normal range of motion.  Lymphadenopathy:    He has no cervical adenopathy.  Neurological: He is alert and oriented to person, place, and time.  Skin: Skin is warm and dry. Capillary refill takes less than 2 seconds.  Nursing note and vitals reviewed.    ED Treatments / Results  Labs (all labs ordered are  listed, but only abnormal results are displayed) Labs Reviewed  COMPREHENSIVE METABOLIC PANEL - Abnormal; Notable for the following:       Result Value   Sodium 131 (*)    Chloride 94 (*)    Glucose, Bld 724 (*)    Creatinine, Ser 1.33 (*)    All other components within normal limits  CBC WITH DIFFERENTIAL/PLATELET - Abnormal; Notable for the following:    WBC 12.4 (*)    Neutro Abs 8.8 (*)    All other components within normal limits  URINALYSIS, ROUTINE W REFLEX MICROSCOPIC - Abnormal; Notable for the following:    Color, Urine COLORLESS (*)    Glucose, UA >=500 (*)    All other components within normal limits  CBC - Abnormal; Notable for the following:    WBC 12.6 (*)    All other components within normal limits  CREATININE, SERUM - Abnormal; Notable for the following:    Creatinine, Ser 1.28 (*)    All other components within normal limits  GLUCOSE, CAPILLARY - Abnormal; Notable for the following:    Glucose-Capillary 384 (*)    All other components within normal limits  CBG MONITORING, ED - Abnormal; Notable for the following:    Glucose-Capillary >600 (*)    All  other components within normal limits  I-STAT VENOUS BLOOD GAS, ED - Abnormal; Notable for the following:    pO2, Ven 31.0 (*)    Bicarbonate 30.2 (*)    Acid-Base Excess 4.0 (*)    All other components within normal limits  CBG MONITORING, ED - Abnormal; Notable for the following:    Glucose-Capillary 539 (*)    All other components within normal limits  RAPID STREP SCREEN (NOT AT [facility])  CULTURE, GROUP A STREP [facility])  HIV ANTIBODY (ROUTINE TESTING)  HEMOGLOBIN A1C    EKG  EKG Interpretation None       Radiology Dg Chest 2 View  Result Date: 07/09/2016 CLINICAL DATA:  Cough. EXAM: CHEST  2 VIEW COMPARISON:  Radiographs of June 08, 2010. FINDINGS: The heart size and mediastinal contours are within normal limits. Both lungs are clear. No pneumothorax or pleural effusion is noted. The visualized  skeletal structures are unremarkable. IMPRESSION: No active cardiopulmonary disease. Electronically Signed   By: Marijo Conception, M.D.   On: 07/09/2016 14:18    Procedures Procedures (including critical care time)  Medications Ordered in ED Medications  magic mouthwash w/lidocaine (5 mLs Oral Given 07/09/16 1852)  insulin aspart (novoLOG) injection 0-9 Units (not administered)  0.9 %  sodium chloride infusion (1,000 mLs Intravenous New Bag/Given 07/09/16 1832)  acetaminophen (TYLENOL) tablet 650 mg (not administered)    Or  acetaminophen (TYLENOL) suppository 650 mg (not administered)  HYDROcodone-acetaminophen (NORCO/VICODIN) 5-325 MG per tablet 1-2 tablet (not administered)  polyethylene glycol (MIRALAX / GLYCOLAX) packet 17 g (not administered)  traZODone (DESYREL) tablet 50 mg (not administered)  ondansetron (ZOFRAN) tablet 4 mg (not administered)    Or  ondansetron (ZOFRAN) injection 4 mg (not administered)  aspirin EC tablet 81 mg (81 mg Oral Given 07/09/16 1850)  polyvinyl alcohol (LIQUIFILM TEARS) 1.4 % ophthalmic solution 2 drop (not administered)  heparin injection 5,000 Units (not administered)  hydrALAZINE (APRESOLINE) injection 5-10 mg (not administered)  sodium chloride 0.9 % bolus 1,000 mL (0 mLs Intravenous Stopped 07/09/16 1524)  insulin aspart (novoLOG) injection 10 Units (10 Units Intravenous Given 07/09/16 1451)  fluconazole (DIFLUCAN) tablet 150 mg (150 mg Oral Given 07/09/16 1850)     Initial Impression / Assessment and Plan / ED Course  I have reviewed the triage vital signs and the nursing notes.  Pertinent labs & imaging results that were available during my care of the patient were reviewed by me and considered in my medical decision making (see chart for details).     Patient presents to the ED with no medical history with a complaints including sore throat, intermittent blurry vision, intermittent headaches, nonproductive cough, rhinorrhea, increased thirst,  increased urination for the past week. Vital signs have been stable in the ED. Patient is nontoxic appearing. On exam patient has oral rash likely thrush which is causing his pain. Rapid strep test was negative. Normal neurological exam without any focal neuro deficits. Urine with greater than 500 glucose. No history of diabetes. Given oral thrush and patient's polyuria and polydipsia high suspicion for new onset diabetes. Blood glucose was obtained that showed over 600. CMP was obtained that showed glucose of 724. New onset diabetes. Does have a mild white count of 12,000. His creatinine is mildly elevated at 1.33. Aanion gap is normal. Bicarbonate is 25. No ketones in urine. PH is normal. Doubt DKA. Electrolytes are normal. Course sounds noted on lung exam. Chest x-ray shows no focal abnormalities concerning for infiltrate. Patient denies  any chest pain, shortness breath, abdominal pain, nausea, emesis. Was given fluids and 10 units of regular insulin. Repeat bsg is 539. Vital signs are stable and patient is in no acute distress. Patient does endorse intermittent blurry vision and headache. Vision is decreased in size bilaterally. No focal neuro deficit or red flag symptoms concerning for SAH. Blurry vision could likely be due to diabetic retinopathy involving follow-up with ophthalmology. Spoke with York Grice, PA-C with [facility] who agrees for admission and will come see the patient in the ED. Patient was discussed with  Dr. Billy Fischer who is agreeable with the above plan.   Final Clinical Impressions(s) / ED Diagnoses   Final diagnoses:  Elevated blood sugar    New Prescriptions Current Discharge Medication List       Doristine Devoid, PA-C 07/09/16 2020    Gareth Morgan, MD 07/09/16 2333

## 2016-07-09 NOTE — Progress Notes (Signed)
EDIN KON [id number] Admitted to 1N88: 07/09/2016 8:10 PM Attending Provider: No att. providers found    TEEGAN BRANDIS is a 51 y.o. male patient admitted from ED awake, alert  & orientated  X 3,  Full Code, VSS - Blood pressure 136/78, pulse 93, temperature 99.3 F (37.4 C), temperature source Oral, resp. rate 20, height 6\' 1"  (1.854 m), weight 113 kg (249 lb 1.9 oz), SpO2 95 %., R/A, no c/o shortness of breath, no c/o chest pain, no distress noted. Non-Tele.   IV site WDL:  hand left, condition patent and no redness with a transparent dsg that's clean dry and intact.  Allergies:   Allergies  Allergen Reactions  . Bee Venom Hives     History reviewed. No pertinent past medical history.  History:  Will be obtained from the patient.  Pt orientation to unit, room and routine. Information packet given to patient/family.  Admission INP armband ID verified with patient, and in place. SR up x 2, fall risk assessment complete with Patient and family verbalizing understanding of risks associated with falls. Pt verbalizes an understanding of how to use the call bell and to call for help before getting out of bed.  Skin, clean-dry- intact without evidence of bruising, or skin tears.   No evidence of skin break down noted on exam.    Will cont to monitor and assist as needed.  Lindalou Hose, RN 07/09/2016 8:10 PM

## 2016-07-09 NOTE — ED Notes (Signed)
Placed patient into a gown on the monitor pt is resting

## 2016-07-09 NOTE — ED Triage Notes (Signed)
Pt sts sore throat and HA with some blurred vision x 2 weeks

## 2016-07-09 NOTE — Progress Notes (Signed)
Inpatient Diabetes Program Recommendations  AACE/ADA: New Consensus Statement on Inpatient Glycemic Control (2015)  Target Ranges:  Prepandial:   less than 140 mg/dL      Peak postprandial:   less than 180 mg/dL (1-2 hours)      Critically ill patients:  140 - 180 mg/dL   Lab Results  Component Value Date   GLUCAP 384 (H) 07/09/2016    Review of Glycemic Control  Diabetes history: None Outpatient Diabetes medications: N/A Current orders for Inpatient glycemic control: Novolog 0-9 units tidwc  Will begin DM education by bedside RN. Order [facility] book Diabetes Videos on pt ed channel O/P DM education  HgbA1C pending. Will need   Inpatient Diabetes Program Recommendations:    Novolog moderate Q4H. Start tonight. Contacted RN to get orders Will likely need basal insulin.  Will f/u on 3/20.  Thank you. Lorenda Peck, RD, LDN, CDE Inpatient Diabetes Coordinator [phone]

## 2016-07-09 NOTE — ED Notes (Signed)
Hooked patient back up to the monitor after bathroom patient is resting

## 2016-07-09 NOTE — ED Notes (Signed)
Pt ambulatory to the restroom.  

## 2016-07-10 ENCOUNTER — Encounter (HOSPITAL_COMMUNITY): Payer: Self-pay | Admitting: *Deleted

## 2016-07-10 DIAGNOSIS — N179 Acute kidney failure, unspecified: Secondary | ICD-10-CM | POA: Diagnosis not present

## 2016-07-10 DIAGNOSIS — E119 Type 2 diabetes mellitus without complications: Secondary | ICD-10-CM | POA: Diagnosis not present

## 2016-07-10 DIAGNOSIS — B370 Candidal stomatitis: Secondary | ICD-10-CM | POA: Diagnosis not present

## 2016-07-10 LAB — BASIC METABOLIC PANEL
Anion gap: 8 (ref 5–15)
BUN: 13 mg/dL (ref 6–20)
CO2: 26 mmol/L (ref 22–32)
Calcium: 8.6 mg/dL — ABNORMAL LOW (ref 8.9–10.3)
Chloride: 105 mmol/L (ref 101–111)
Creatinine, Ser: 1.03 mg/dL (ref 0.61–1.24)
GFR calc Af Amer: >60 mL/min (ref >60)
GFR calc non Af Amer: >60 mL/min (ref >60)
Glucose, Bld: 198 mg/dL — ABNORMAL HIGH (ref 65–99)
Potassium: 3.8 mmol/L (ref 3.5–5.1)
Sodium: 139 mmol/L (ref 135–145)

## 2016-07-10 LAB — GLUCOSE, CAPILLARY
GLUCOSE-CAPILLARY: 205 mg/dL — AB (ref 65–99)
GLUCOSE-CAPILLARY: 238 mg/dL — AB (ref 65–99)
GLUCOSE-CAPILLARY: 375 mg/dL — AB (ref 65–99)
Glucose-Capillary: 297 mg/dL — ABNORMAL HIGH (ref 65–99)

## 2016-07-10 LAB — CBC
HCT: 43.1 % (ref 39.0–52.0)
Hemoglobin: 14.9 g/dL (ref 13.0–17.0)
MCH: 28.4 pg (ref 26.0–34.0)
MCHC: 34.6 g/dL (ref 30.0–36.0)
MCV: 82.1 fL (ref 78.0–100.0)
Platelets: 205 10*3/uL (ref 150–400)
RBC: 5.25 MIL/uL (ref 4.22–5.81)
RDW: 12.5 % (ref 11.5–15.5)
WBC: 9.8 10*3/uL (ref 4.0–10.5)

## 2016-07-10 LAB — HEMOGLOBIN A1C
Hgb A1c MFr Bld: 13.1 % — ABNORMAL HIGH (ref 4.8–5.6)
Mean Plasma Glucose: 329 mg/dL

## 2016-07-10 LAB — CULTURE, GROUP A STREP (THRC)

## 2016-07-10 LAB — HIV ANTIBODY (ROUTINE TESTING W REFLEX): HIV SCREEN 4TH GENERATION: NONREACTIVE

## 2016-07-10 MED ORDER — GLIPIZIDE 5 MG PO TABS: 2.5000 mg | ORAL_TABLET | Freq: Two times a day (BID) | ORAL | 0 refills | Status: DC

## 2016-07-10 MED ORDER — ORAL CARE MOUTH RINSE: 15.0000 mL | Freq: Two times a day (BID) | OROMUCOSAL | Status: DC

## 2016-07-10 MED ORDER — BLOOD GLUCOSE METER KIT: PACK | 0 refills | Status: DC

## 2016-07-10 MED ORDER — METFORMIN HCL 500 MG PO TABS: 500.0000 mg | ORAL_TABLET | Freq: Two times a day (BID) | ORAL | 0 refills | Status: DC

## 2016-07-10 NOTE — Discharge Summary (Signed)
Physician Discharge Summary  Marco Campbell:[id number] DOB: 05/06/1965 DOA: 07/09/2016  PCP: Patricia Nettle, MD  Admit date: 07/09/2016 Discharge date: 07/10/2016  Admitted From:home Disposition:home  Recommendations for Outpatient Follow-up:  1. Follow up with PCP in 1-2 weeks 2. Please obtain BMP/CBC in one week   Home Health: glucometer Equipment/Devices:diabetic supplies Discharge Condition:stable CODE STATUS:full Diet recommendation:carb modified heart healthy diet.  Brief/Interim Summary: 51 year old male with no known significant past medical history who presented with complaint of sore throat, blurry vision, dry, and not feeling well. On admission patient was found to have tachycardia, elevated blood sugar level to 724, elevated serum creatinine level and admitted for further evaluation.  New onset diabetes mellitus, likely type 2 diabetes: A1c of 13.1. Patient was treated with IV fluid and sliding scale insulin in the hospital. Evaluated by diabetic educator. Education provided to the patient regarding checking blood sugar level and follow-up with PCP. Plan to discharge patient with oral metformin and glipizide. Patient was educated about signs and symptoms of hypoglycemia. Recommended to follow up with PCP for referral to ophthalmologist for evaluation of possible diabetic retinopathy. Patient verbalized understanding. He clinically felt better. Denies nausea vomiting chest pain shortness of breath. He is able to eat well. I am waiting well. Clinically stable.  Acute kidney injury and hyponatremia likely in the setting of uncontrolled hyperglycemia. Serum sodium level and serum creatinine level improved.  Patient was treated with fluconazole for oral thrush.  Patient is clinically stable. I discussed with the case manager, Education officer, museum and nursing staff regarding discharge planning. I also discussed with the case manager to discuss about patient requiring PCP  follow-up.  Discharge Diagnoses:  Active Problems:   Diabetes mellitus, new onset ([place])   Oral thrush   Blurry vision   AKI (acute kidney injury) [facility])    Discharge Instructions  Discharge Instructions    Call MD for:  difficulty breathing, headache or visual disturbances    Complete by:  As directed    Call MD for:  extreme fatigue    Complete by:  As directed    Call MD for:  hives    Complete by:  As directed    Call MD for:  persistant dizziness or light-headedness    Complete by:  As directed    Call MD for:  persistant nausea and vomiting    Complete by:  As directed    Call MD for:  severe uncontrolled pain    Complete by:  As directed    Call MD for:  temperature >100.4    Complete by:  As directed    Diet - low sodium heart healthy    Complete by:  As directed    Diet Carb Modified    Complete by:  As directed    Discharge instructions    Complete by:  As directed    Please monitor her sugar level twice a day. Please follow up with your PCP in 1-2 weeks. You also need to be seen by an ophthalmologist for eye exam. Please discuss with her PCP. Please monitor for any sign and symptom of low blood sugar level. If you have sweating, palpitation, dizziness or any new symptoms please contact blood sugar level and contact your PCP.   Increase activity slowly    Complete by:  As directed      Allergies as of 07/10/2016      Reactions   Bee Venom Hives      Medication List  STOP taking these medications   EPINEPHrine 0.3 mg/0.3 mL Soaj injection Commonly known as:  EPI-PEN     TAKE these medications   blood glucose meter kit and supplies Dispense based on patient and insurance preference. Use up to four times daily as directed. (FOR ICD-9 250.00, 250.01).   glipiZIDE 5 MG tablet Commonly known as:  GLUCOTROL Take 0.5 tablets (2.5 mg total) by mouth 2 (two) times daily before a meal.   metFORMIN 500 MG tablet Commonly known as:  GLUCOPHAGE Take 1 tablet  (500 mg total) by mouth 2 (two) times daily with a meal.      Follow-up Information    SPRUILL,JEROME O, MD. Schedule an appointment as soon as possible for a visit in 1 week(s).   Specialty:  Cardiology Contact information: [place] [address] [place]. Schedule an appointment as soon as possible for a visit in 1 week(s).   Why:  or follow up with your PCP if you have one. Contact information: [place] [address] [phone]         Allergies  Allergen Reactions  . Bee Venom Hives    Consultations: None  Procedures/Studies: None  Subjective: Patient was seen and examined at bedside. Patient denied fever, chills, headache, dizziness, nausea, vomiting, chest pain or shortness of breath. No blurry vision today. Able to tolerate diet well. No urinary complaints.  Discharge Exam: Vitals:   07/09/16 2150 07/10/16 0406  BP: (!) 144/86 135/81  Pulse: 83 77  Resp: 17 18  Temp: 98 F (36.7 C)    Vitals:   07/09/16 1800 07/09/16 1830 07/09/16 2150 07/10/16 0406  BP: 140/73 136/78 (!) 144/86 135/81  Pulse: 88 93 83 77  Resp: 18 20 17 18   Temp:  99.3 F (37.4 C) 98 F (36.7 C)   TempSrc:  Oral    SpO2: 93% 95% 100% 96%  Weight: 113 kg (249 lb 1.9 oz)     Height: 6' 1"  (1.854 m)       General: Pt is alert, awake, not in acute distress Cardiovascular: RRR, S1/S2 +, no rubs, no gallops Respiratory: CTA bilaterally, no wheezing, no rhonchi Abdominal: Soft, NT, ND, bowel sounds + Extremities: no edema, no cyanosis    The results of significant diagnostics from this hospitalization (including imaging, microbiology, ancillary and laboratory) are listed below for reference.     Microbiology: Recent Results (from the past 240 hour(s))  Rapid strep screen     Status: None   Collection Time: 07/09/16 12:30 PM  Result Value Ref Range Status    Streptococcus, Group A Screen (Direct) NEGATIVE NEGATIVE Final    Comment: (NOTE) A Rapid Antigen test may result negative if the antigen level in the sample is below the detection level of this test. The FDA has not cleared this test as a stand-alone test therefore the rapid antigen negative result has reflexed to a Group A Strep culture.   Culture, group A strep     Status: None   Collection Time: 07/09/16 12:30 PM  Result Value Ref Range Status   Specimen Description THROAT  Final   Special Requests NONE Reflexed from [id number]  Final   Culture FEW STREPTOCOCCUS,BETA HEMOLYTIC NOT GROUP A  Final   Report Status 07/10/2016 FINAL  Final     Labs: BNP (last 3 results) No results for input(s): BNP in the last 8760  hours. Basic Metabolic Panel:  Recent Labs Lab 07/09/16 1230 07/09/16 1839 07/10/16 0608  NA 131*  --  139  K 4.4  --  3.8  CL 94*  --  105  CO2 25  --  26  GLUCOSE 724*  --  198*  BUN 14  --  13  CREATININE 1.33* 1.28* 1.03  CALCIUM 9.1  --  8.6*   Liver Function Tests:  Recent Labs Lab 07/09/16 1230  AST 16  ALT 21  ALKPHOS 102  BILITOT 0.4  PROT 7.2  ALBUMIN 3.5   No results for input(s): LIPASE, AMYLASE in the last 168 hours. No results for input(s): AMMONIA in the last 168 hours. CBC:  Recent Labs Lab 07/09/16 1230 07/09/16 1839 07/10/16 0608  WBC 12.4* 12.6* 9.8  NEUTROABS 8.8*  --   --   HGB 15.7 15.4 14.9  HCT 44.9 44.4 43.1  MCV 82.8 82.2 82.1  PLT 208 198 205   Cardiac Enzymes: No results for input(s): CKTOTAL, CKMB, CKMBINDEX, TROPONINI in the last 168 hours. BNP: Invalid input(s): POCBNP CBG:  Recent Labs Lab 07/09/16 2147 07/10/16 0031 07/10/16 0405 07/10/16 0802 07/10/16 1200  GLUCAP 418* 375* 238* 205* 297*   D-Dimer No results for input(s): DDIMER in the last 72 hours. Hgb A1c  Recent Labs  07/09/16 1839  HGBA1C 13.1*   Lipid Profile No results for input(s): CHOL, HDL, LDLCALC, TRIG, CHOLHDL, LDLDIRECT in  the last 72 hours. Thyroid function studies No results for input(s): TSH, T4TOTAL, T3FREE, THYROIDAB in the last 72 hours.  Invalid input(s): FREET3 Anemia work up No results for input(s): VITAMINB12, FOLATE, FERRITIN, TIBC, IRON, RETICCTPCT in the last 72 hours. Urinalysis    Component Value Date/Time   COLORURINE COLORLESS (A) 07/09/2016 1255   APPEARANCEUR CLEAR 07/09/2016 1255   LABSPEC 1.023 07/09/2016 1255   PHURINE 7.0 07/09/2016 1255   GLUCOSEU >=500 (A) 07/09/2016 1255   HGBUR NEGATIVE 07/09/2016 1255   BILIRUBINUR NEGATIVE 07/09/2016 1255   KETONESUR NEGATIVE 07/09/2016 1255   PROTEINUR NEGATIVE 07/09/2016 1255   NITRITE NEGATIVE 07/09/2016 1255   LEUKOCYTESUR NEGATIVE 07/09/2016 1255   Sepsis Labs Invalid input(s): PROCALCITONIN,  WBC,  LACTICIDVEN Microbiology Recent Results (from the past 240 hour(s))  Rapid strep screen     Status: None   Collection Time: 07/09/16 12:30 PM  Result Value Ref Range Status   Streptococcus, Group A Screen (Direct) NEGATIVE NEGATIVE Final    Comment: (NOTE) A Rapid Antigen test may result negative if the antigen level in the sample is below the detection level of this test. The FDA has not cleared this test as a stand-alone test therefore the rapid antigen negative result has reflexed to a Group A Strep culture.   Culture, group A strep     Status: None   Collection Time: 07/09/16 12:30 PM  Result Value Ref Range Status   Specimen Description THROAT  Final   Special Requests NONE Reflexed from [id number]  Final   Culture FEW STREPTOCOCCUS,BETA HEMOLYTIC NOT GROUP A  Final   Report Status 07/10/2016 FINAL  Final     Time coordinating discharge: 26 minutes  SIGNED:   Rosita Fire, MD  [facility] 07/10/2016, 12:55 PM  If 7PM-7AM, please contact night-coverage [email or web address] Password [email or web address]

## 2016-07-10 NOTE — Progress Notes (Signed)
Marco Campbell to be D/C'd to home per MD order.  Discussed with the patient and all questions fully answered.  VSS, Skin clean, dry and intact without evidence of skin break down, no evidence of skin tears noted. IV catheter discontinued intact. Site without signs and symptoms of complications. Dressing and pressure applied.  An After Visit Summary was printed and given to the patient. Patient received prescriptions.  D/c diabetes education completed with patient/family including follow up instructions, medication list, d/c activities limitations if indicated, with other d/c instructions as indicated by MD - patient able to verbalize understanding, all questions fully answered.   Patient instructed to return to ED, call 911, or call MD for any changes in condition.   Patient escorted via [place], and D/C home via private auto.  Lynann Beaver 07/10/2016 2:37 PM

## 2016-07-10 NOTE — Care Management Note (Addendum)
Case Management Note  Patient Details  Name: AHAN EISENBERGER MRN: [id number] Date of Birth: [birth year]  Subjective/Objective:         Presents with DKA.          PCP: Wallene Huh  Action/Plan: Plan is to d/c to home today, pending hemoglobin A1C results.... diabetes teaching per nurse. CM spoke with pt regarding f/u with diabetes outpatient nutritionist. Pt states will f/u with PCP in obtaining appointment for diabetes nutritionist.  Expected Discharge Date:    07/10/2016          Expected Discharge Plan:  Home/Self Care (Resides with housemate)  In-House Referral:     Discharge planning Services  CM Consult  Post Acute Care Choice:    Choice offered to:     DME Arranged:    DME Agency:     HH Arranged:    [place] Agency:     Status of Service:  Completed, signed off  If discussed at [employer], dates discussed:    Additional Comments:  Sharin Mons, RN 07/10/2016, 12:47 PM

## 2016-07-11 NOTE — Progress Notes (Signed)
Patient was newly dx with DM on 07/09/16 and was discharged on 07/10/16 before he could be seen by Inpatient Diabetes Coordinator. Marco Deter, RN, MSN, Diabetes Coordinator called patient on 07/10/16 over the phone to discuss new DM dx but no answer and message was left for patient to return call. This Diabetes Coordinator called patient again today and spoke with him over the phone regarding new DM dx.  Discussed A1C results (13.1% on 07/09/16 ) and explained what an A1C is. Discussed basic pathophysiology of DM Type 2, basic home care, importance of checking CBGs and maintaining good CBG control to prevent long-term and short-term complications. Reviewed glucose and A1C goals.  Reviewed signs and symptoms of hyperglycemia and hypoglycemia along with treatment for both. Discussed how hyperglycemia can lead to damage inside blood vessels over time which leads to complications.  Discussed impact of nutrition, exercise, stress, sickness, and medications on diabetes control. Encouraged patient to read entire Living Well with diabetes booklet.  Inquired about glucose monitoring since being discharged on 07/10/16 and patient reported that he had not went and picked up the glucometer yet but was planning to go today after his classes today.  Asked patient to check his glucose 4 times per day (before meals and at bedtime) and to keep a log book of glucose readings. Inquired if patient had a follow up appointment scheduled with PCP yet and he states he was planning to call today. Encouraged patient to call today and make appointment and to check glucose 4 times per day so his PCP can use the glucose trends to continue to make adjustments with DM medications if needed. Explained to patient that he was ordered an outpatient diabetes education referral and they should be contacting him over the next week to make an appointment.   Patient verbalized understanding of information discussed and he states that he has no further questions  at this time related to diabetes.   Thanks, Barnie Alderman, RN, MSN, CDE Diabetes Coordinator Inpatient Diabetes Program [phone] (Team Pager from 8am to 5pm)

## 2016-09-05 ENCOUNTER — Encounter: Payer: Self-pay | Admitting: Family Medicine

## 2016-09-05 ENCOUNTER — Ambulatory Visit (INDEPENDENT_AMBULATORY_CARE_PROVIDER_SITE_OTHER): Payer: BC Managed Care – PPO | Admitting: Family Medicine

## 2016-09-05 VITALS — BP 150/90 | HR 88 | Wt 256.8 lb

## 2016-09-05 DIAGNOSIS — I10 Essential (primary) hypertension: Secondary | ICD-10-CM

## 2016-09-05 DIAGNOSIS — Z7689 Persons encountering health services in other specified circumstances: Secondary | ICD-10-CM

## 2016-09-05 DIAGNOSIS — E669 Obesity, unspecified: Secondary | ICD-10-CM | POA: Diagnosis not present

## 2016-09-05 DIAGNOSIS — R21 Rash and other nonspecific skin eruption: Secondary | ICD-10-CM

## 2016-09-05 DIAGNOSIS — E1165 Type 2 diabetes mellitus with hyperglycemia: Secondary | ICD-10-CM | POA: Diagnosis not present

## 2016-09-05 DIAGNOSIS — IMO0001 Reserved for inherently not codable concepts without codable children: Secondary | ICD-10-CM

## 2016-09-05 LAB — CBC WITH DIFFERENTIAL/PLATELET
BASOS ABS: 0 {cells}/uL (ref 0–200)
Basophils Relative: 0 %
EOS PCT: 1 %
Eosinophils Absolute: 95 cells/uL (ref 15–500)
HEMATOCRIT: 45.6 % (ref 38.5–50.0)
HEMOGLOBIN: 15.3 g/dL (ref 13.2–17.1)
LYMPHS ABS: 2090 {cells}/uL (ref 850–3900)
Lymphocytes Relative: 22 %
MCH: 28.5 pg (ref 27.0–33.0)
MCHC: 33.6 g/dL (ref 32.0–36.0)
MCV: 84.9 fL (ref 80.0–100.0)
MONO ABS: 950 {cells}/uL (ref 200–950)
MPV: 9 fL (ref 7.5–12.5)
Monocytes Relative: 10 %
NEUTROS ABS: 6365 {cells}/uL (ref 1500–7800)
Neutrophils Relative %: 67 %
Platelets: 270 10*3/uL (ref 140–400)
RBC: 5.37 MIL/uL (ref 4.20–5.80)
RDW: 13.7 % (ref 11.0–15.0)
WBC: 9.5 10*3/uL (ref 4.0–10.5)

## 2016-09-05 LAB — COMPLETE METABOLIC PANEL WITH GFR
ALBUMIN: 3.7 g/dL (ref 3.6–5.1)
ALK PHOS: 59 U/L (ref 40–115)
ALT: 8 U/L — ABNORMAL LOW (ref 9–46)
AST: 12 U/L (ref 10–35)
BILIRUBIN TOTAL: 0.4 mg/dL (ref 0.2–1.2)
BUN: 10 mg/dL (ref 7–25)
CO2: 24 mmol/L (ref 20–31)
Calcium: 8.9 mg/dL (ref 8.6–10.3)
Chloride: 106 mmol/L (ref 98–110)
Creat: 0.98 mg/dL (ref 0.70–1.33)
GFR, Est African American: >89 mL/min (ref >=60)
GFR, Est Non African American: >89 mL/min (ref >=60)
Glucose, Bld: 99 mg/dL (ref 65–99)
Potassium: 4 mmol/L (ref 3.5–5.3)
SODIUM: 139 mmol/L (ref 135–146)
TOTAL PROTEIN: 6.7 g/dL (ref 6.1–8.1)

## 2016-09-05 LAB — LIPID PANEL
CHOL/HDL RATIO: 4.7 ratio (ref <5.0)
Cholesterol: 192 mg/dL (ref <200)
HDL: 41 mg/dL (ref >40)
LDL Cholesterol: 126 mg/dL — ABNORMAL HIGH (ref <100)
Triglycerides: 124 mg/dL (ref <150)
VLDL: 25 mg/dL (ref <30)

## 2016-09-05 LAB — POCT URINALYSIS DIPSTICK
Bilirubin, UA: NEGATIVE
Glucose, UA: NEGATIVE
Ketones, UA: NEGATIVE
Leukocytes, UA: NEGATIVE
Nitrite, UA: NEGATIVE
PROTEIN UA: NEGATIVE
RBC UA: NEGATIVE
SPEC GRAV UA: 1.02 (ref 1.010–1.025)
UROBILINOGEN UA: NEGATIVE U/dL — AB
pH, UA: 6 (ref 5.0–8.0)

## 2016-09-05 LAB — POCT GLYCOSYLATED HEMOGLOBIN (HGB A1C): Hemoglobin A1C: 8.5

## 2016-09-05 LAB — TSH: TSH: 0.82 m[IU]/L (ref 0.40–4.50)

## 2016-09-05 LAB — GLUCOSE, POCT (MANUAL RESULT ENTRY): POC GLUCOSE: 103 mg/dL — AB (ref 70–99)

## 2016-09-05 MED ORDER — TRIAMCINOLONE ACETONIDE 0.1 % EX CREA: 1.0000 "application " | TOPICAL_CREAM | Freq: Two times a day (BID) | CUTANEOUS | 0 refills | Status: DC

## 2016-09-05 MED ORDER — METFORMIN HCL 1000 MG PO TABS: 1000.0000 mg | ORAL_TABLET | Freq: Two times a day (BID) | ORAL | 1 refills | Status: DC

## 2016-09-05 MED ORDER — LISINOPRIL 5 MG PO TABS: 5.0000 mg | ORAL_TABLET | Freq: Every day | ORAL | 1 refills | Status: DC

## 2016-09-05 NOTE — Patient Instructions (Addendum)
Check your blood sugars twice daily (once fasting in the morning and 2 hours after a meal) for the next 2 weeks.   Stop the Glipizide and take Metformin 1,000 mg twice daily with meals. Start the lisinopril once daily.   Check your blood pressure 2-3 times per week for the next 2 weeks.   BRING IN ALL OF YOUR READINGS IN 2 WEEKS.   Call and schedule a diabetic eye exam. [facility] [phone]. [facility] [phone]  Call and schedule with the nutritionist. [phone] Address: [address]      Marco Campbell stands for "Dietary Approaches to Stop Hypertension." The DASH eating plan is a healthy eating plan that has been shown to reduce high blood pressure (hypertension). It may also reduce your risk for type 2 diabetes, heart disease, and stroke. The DASH eating plan may also help with weight loss. What are tips for following this plan? General guidelines   Avoid eating more than 2,300 mg (milligrams) of salt (sodium) a day. If you have hypertension, you may need to reduce your sodium intake to 1,500 mg a day.  Limit alcohol intake to no more than 1 drink a day for nonpregnant women and 2 drinks a day for men. One drink equals 12 oz of beer, 5 oz of wine, or 1 oz of hard liquor.  Work with your health care provider to maintain a healthy body weight or to lose weight. Ask what an ideal weight is for you.  Get at least 30 minutes of exercise that causes your heart to beat faster (aerobic exercise) most days of the week. Activities may include walking, swimming, or biking.  Work with your health care provider or diet and nutrition specialist (dietitian) to adjust your eating plan to your individual calorie needs. Reading food labels   Check food labels for the amount of sodium per serving. Choose foods with less than 5 percent of the Daily Value of sodium. Generally, foods with less than 300 mg of sodium per serving fit into this eating  plan.  To find whole grains, look for the word "whole" as the first word in the ingredient list. Shopping   Buy products labeled as "low-sodium" or "no salt added."  Buy fresh foods. Avoid canned foods and premade or frozen meals. Cooking   Avoid adding salt when cooking. Use salt-free seasonings or herbs instead of table salt or sea salt. Check with your health care provider or pharmacist before using salt substitutes.  Do not fry foods. Cook foods using healthy methods such as baking, boiling, grilling, and broiling instead.  Cook with heart-healthy oils, such as olive, canola, soybean, or sunflower oil. Meal planning    Eat a balanced diet that includes:  5 or more servings of fruits and vegetables each day. At each meal, try to fill half of your plate with fruits and vegetables.  Up to 6-8 servings of whole grains each day.  Less than 6 oz of lean meat, poultry, or fish each day. A 3-oz serving of meat is about the same size as a deck of cards. One egg equals 1 oz.  2 servings of low-fat dairy each day.  A serving of nuts, seeds, or beans 5 times each week.  Heart-healthy fats. Healthy fats called Omega-3 fatty acids are found in foods such as flaxseeds and coldwater fish, like sardines, salmon, and mackerel.  Limit how much you eat of the following:  Canned or prepackaged foods.  Food that is high in trans fat, such as fried foods.  Food that is high in saturated fat, such as fatty meat.  Sweets, desserts, sugary drinks, and other foods with added sugar.  Full-fat dairy products.  Do not salt foods before eating.  Try to eat at least 2 vegetarian meals each week.  Eat more home-cooked food and less restaurant, buffet, and fast food.  When eating at a restaurant, ask that your food be prepared with less salt or no salt, if possible. What foods are recommended? The items listed may not be a complete list. Talk with your dietitian about what dietary choices are  best for you. Grains  Whole-grain or whole-wheat bread. Whole-grain or whole-wheat pasta. Brown rice. Modena Morrow. Bulgur. Whole-grain and low-sodium cereals. Pita bread. Low-fat, low-sodium crackers. Whole-wheat flour tortillas. Vegetables  Fresh or frozen vegetables (raw, steamed, roasted, or grilled). Low-sodium or reduced-sodium tomato and vegetable juice. Low-sodium or reduced-sodium tomato sauce and tomato paste. Low-sodium or reduced-sodium canned vegetables. Fruits  All fresh, dried, or frozen fruit. Canned fruit in natural juice (without added sugar). Meat and other protein foods  Skinless chicken or [place]. Ground chicken or [place]. Pork with fat trimmed off. Fish and seafood. Egg whites. Dried beans, peas, or lentils. Unsalted nuts, nut butters, and seeds. Unsalted canned beans. Lean cuts of beef with fat trimmed off. Low-sodium, lean deli meat. Dairy  Low-fat (1%) or fat-free (skim) milk. Fat-free, low-fat, or reduced-fat cheeses. Nonfat, low-sodium ricotta or cottage cheese. Low-fat or nonfat yogurt. Low-fat, low-sodium cheese. Fats and oils  Soft margarine without trans fats. Vegetable oil. Low-fat, reduced-fat, or light mayonnaise and salad dressings (reduced-sodium). Canola, safflower, olive, soybean, and sunflower oils. Avocado. Seasoning and other foods  Herbs. Spices. Seasoning mixes without salt. Unsalted popcorn and pretzels. Fat-free sweets. What foods are not recommended? The items listed may not be a complete list. Talk with your dietitian about what dietary choices are best for you. Grains  Baked goods made with fat, such as croissants, muffins, or some breads. Dry pasta or rice meal packs. Vegetables  Creamed or fried vegetables. Vegetables in a cheese sauce. Regular canned vegetables (not low-sodium or reduced-sodium). Regular canned tomato sauce and paste (not low-sodium or reduced-sodium). Regular tomato and vegetable juice (not low-sodium or reduced-sodium).  Angie Fava. Olives. Fruits  Canned fruit in a light or heavy syrup. Fried fruit. Fruit in cream or butter sauce. Meat and other protein foods  Fatty cuts of meat. Ribs. Fried meat. Berniece Salines. Sausage. Bologna and other processed lunch meats. Salami. Fatback. Hotdogs. Bratwurst. Salted nuts and seeds. Canned beans with added salt. Canned or smoked fish. Whole eggs or egg yolks. Chicken or [place] with skin. Dairy  Whole or 2% milk, cream, and half-and-half. Whole or full-fat cream cheese. Whole-fat or sweetened yogurt. Full-fat cheese. Nondairy creamers. Whipped toppings. Processed cheese and cheese spreads. Fats and oils  Butter. Stick margarine. Lard. Shortening. Ghee. Bacon fat. Tropical oils, such as coconut, palm kernel, or palm oil. Seasoning and other foods  Salted popcorn and pretzels. Onion salt, garlic salt, seasoned salt, table salt, and sea salt. Worcestershire sauce. Tartar sauce. Barbecue sauce. Teriyaki sauce. Soy sauce, including reduced-sodium. Steak sauce. Canned and packaged gravies. Fish sauce. Oyster sauce. Cocktail sauce. Horseradish that you find on the shelf. Ketchup. Mustard. Meat flavorings and tenderizers. Bouillon cubes. Hot sauce and Tabasco sauce. Premade or packaged marinades. Premade or packaged taco seasonings. Relishes. Regular salad dressings. Where to find more information:  National Heart, Lung, and Blood  Institute: https://wilson-eaton.com/  American Heart Association: www.heart.org Summary  The DASH eating plan is a healthy eating plan that has been shown to reduce high blood pressure (hypertension). It may also reduce your risk for type 2 diabetes, heart disease, and stroke.  With the DASH eating plan, you should limit salt (sodium) intake to 2,300 mg a day. If you have hypertension, you may need to reduce your sodium intake to 1,500 mg a day.  When on the DASH eating plan, aim to eat more fresh fruits and vegetables, whole grains, lean proteins, low-fat dairy, and  heart-healthy fats.  Work with your health care provider or diet and nutrition specialist (dietitian) to adjust your eating plan to your individual calorie needs. This information is not intended to replace advice given to you by your health care provider. Make sure you discuss any questions you have with your health care provider. Document Released: 03/29/2011 Document Revised: 04/02/2016 Document Reviewed: 04/02/2016 Elsevier Interactive Patient Education  2017 [employer].

## 2016-09-05 NOTE — Progress Notes (Signed)
Subjective:    Patient ID: Marco Campbell, male    DOB: [birth year], 51 y.o.   MRN: [id number]  HPI Chief Complaint  Patient presents with  . new pt    new pt diagnosed with diabetes. needs refills on meds. been out in 3 weeks.    He is new to the practice and here to establish care and for newly diagnosed diabetes. States he went to the ED in March with complaints of blurry vision, increased thirst, and urinating frequently.  He was diagnosed with type 2 DM with a hemoglobin A1c of 13.1%. His serum glucose was 724 and he had elevated serum creatinine level.  He was prescribed medication and told to follow up with his PCP. States he has not been able to get in touch with them.   He was prescribed Metformin 500 mg bid with meals and glipizide 2.5 mg bid with meals at the ED.  States he took both medications until he ran out and since has only been taking Metformin 500 mg twice daily. States he has been taking his sister's Metformin.   States he has not been checking his blood sugars because he could not figure out how to work the lancets.   Denies fever, chills, headache, dizziness, vision changes, chest pain, palpitations, shortness of breath, cough, abdominal pain, N/V/D. States he no longer has increased thirst or urinary frequency.   He was advised to follow up with the diabetes nutritionist but has not done this.   Denies history of HTN but states he has elevated readings off and on for the past year.   Previous medical care: PCP Wallene Huh MD  Other providers: none   Also complains of a rash to his bilateral forearms for the past 2 months. Has not tried anything for this. Rash is occasionally pruritic.   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 Works at [employer] also.  Smokes 10 cigarettes per day for the past 30 years. States he quit one time but is not ready to try and stop right  now.  drinks alcohol occasionally. denies drug use  Last Eye Exam: needs a diabetic eye exam.   Reviewed allergies, medications, past medical, surgical, family, and social history.   Review of Systems Pertinent positives and negatives in the history of present illness.     Objective:   Physical Exam BP (!) 150/90   Pulse 88   Wt 256 lb 12.8 oz (116.5 kg)   BMI 33.88 kg/m   Alert and in no distress. Pharyngeal area is normal. Neck is supple without adenopathy or thyromegaly. Cardiac exam shows a regular sinus rhythm without murmurs or gallops. Lungs are clear to auscultation. Extremities without edema, normal pulses.       Assessment & Plan:  Uncontrolled type 2 diabetes mellitus without complication, without long-term current use of insulin ([place]) - Plan: HgB A1c, POCT glucose (manual entry), CBC with Differential/Platelet, COMPLETE METABOLIC PANEL WITH GFR, TSH, Lipid panel, POCT urinalysis dipstick, Microalbumin / creatinine urine ratio, metFORMIN (GLUCOPHAGE) 1000 MG tablet  Hypertension, essential - Plan: Lipid panel, POCT urinalysis dipstick, Microalbumin / creatinine urine ratio, lisinopril (PRINIVIL,ZESTRIL) 5 MG tablet  Obesity (BMI 30-39.9) - Plan: Lipid panel  Rash and nonspecific skin eruption  Encounter to establish care  POCT glucose 103  Hemoglobin A1c 8.5%  Urinalysis dipstick: neg  Discussed that his blood sugars are no longer  in the toxic range. Provided a significant amount of education regarding diabetes.  Plan to stop Glipizide and increase Metformin to 1,000 mg twice daily.  Educated him on how to check blood sugars and provided him with samples of lancets he can use. He will check blood sugars twice daily for the next 2 weeks.  He has made significant changes to his diet.  Encouraged him to  call and schedule with the [facility].  Advised him to also call and schedule a diabetic eye exam.  He will start taking Lisinopril for renal protection and his BP is  elevated. Encouraged him to buy a BP cuff and check his BP 2-3 times per week.  He will keep readings of blood sugars and BPs and bring these to his appointment in 2 weeks.  Spent at least 45 minutes with patient and at least 50% was in counseling and coordination of care.

## 2016-09-06 LAB — MICROALBUMIN / CREATININE URINE RATIO
CREATININE, URINE: 126 mg/dL (ref 20–370)
Microalb Creat Ratio: 10 mcg/mg creat (ref <30)
Microalb, Ur: 1.3 mg/dL

## 2016-09-10 ENCOUNTER — Encounter: Payer: Self-pay | Admitting: Internal Medicine

## 2016-11-21 ENCOUNTER — Telehealth: Payer: Self-pay

## 2016-11-21 NOTE — Telephone Encounter (Signed)
Patient signed medical records release. I have tried to obtain requested medical records from Dr. Margarette Canada office. I have faxed requested on 09/06/2016, 10/04/2016, and mailed request on 09/06/2016. His office closed late Spring 2018.  I have not been able to get records. Just FYI. Marco Campbell

## 2016-11-25 NOTE — Telephone Encounter (Signed)
Apparently records are not available, Wells Guiles has tried to get them. He has not followed up as recommended since his visit in May. He needs an ov. Thanks.

## 2016-11-26 NOTE — Telephone Encounter (Signed)
Left message for pt to call back and schedule an appt for DM

## 2017-01-13 ENCOUNTER — Emergency Department (HOSPITAL_COMMUNITY): Payer: BC Managed Care – PPO

## 2017-01-13 ENCOUNTER — Encounter (HOSPITAL_COMMUNITY): Payer: Self-pay | Admitting: *Deleted

## 2017-01-13 DIAGNOSIS — M545 Low back pain: Secondary | ICD-10-CM | POA: Diagnosis present

## 2017-01-13 DIAGNOSIS — F17210 Nicotine dependence, cigarettes, uncomplicated: Secondary | ICD-10-CM | POA: Insufficient documentation

## 2017-01-13 DIAGNOSIS — Y92410 Unspecified street and highway as the place of occurrence of the external cause: Secondary | ICD-10-CM | POA: Diagnosis not present

## 2017-01-13 DIAGNOSIS — I10 Essential (primary) hypertension: Secondary | ICD-10-CM | POA: Diagnosis not present

## 2017-01-13 DIAGNOSIS — E119 Type 2 diabetes mellitus without complications: Secondary | ICD-10-CM | POA: Insufficient documentation

## 2017-01-13 DIAGNOSIS — Z79899 Other long term (current) drug therapy: Secondary | ICD-10-CM | POA: Diagnosis not present

## 2017-01-13 DIAGNOSIS — Z7984 Long term (current) use of oral hypoglycemic drugs: Secondary | ICD-10-CM | POA: Insufficient documentation

## 2017-01-13 DIAGNOSIS — M791 Myalgia: Secondary | ICD-10-CM | POA: Insufficient documentation

## 2017-01-13 DIAGNOSIS — M25562 Pain in left knee: Secondary | ICD-10-CM | POA: Insufficient documentation
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
## 2017-01-14 ENCOUNTER — Emergency Department (HOSPITAL_COMMUNITY)
Admission: EM | Admit: 2017-01-14 | Discharge: 2017-01-14 | Disposition: A | Discharge status: Home / Self Care | Payer: BC Managed Care – PPO | Attending: Emergency Medicine | Admitting: Emergency Medicine

## 2017-01-14 DIAGNOSIS — M7918 Myalgia, other site: Secondary | ICD-10-CM

## 2017-01-14 DIAGNOSIS — M791 Myalgia: Secondary | ICD-10-CM | POA: Diagnosis not present

## 2017-01-14 HISTORY — DX: Type 2 diabetes mellitus without complications: E11.9

## 2017-01-14 HISTORY — DX: Essential (primary) hypertension: I10

## 2017-01-14 MED ORDER — CYCLOBENZAPRINE HCL 10 MG PO TABS
10.0000 mg | ORAL_TABLET | Freq: Once | ORAL | Status: AC
  Administered 2017-01-14: 10 mg via ORAL
  Filled 2017-01-14: qty 1

## 2017-01-14 MED ORDER — CYCLOBENZAPRINE HCL 10 MG PO TABS: 10.0000 mg | ORAL_TABLET | Freq: Two times a day (BID) | ORAL | 0 refills | Status: DC | PRN

## 2017-01-14 MED ORDER — IBUPROFEN 600 MG PO TABS: 600.0000 mg | ORAL_TABLET | Freq: Four times a day (QID) | ORAL | 0 refills | Status: DC | PRN

## 2017-01-14 MED ORDER — IBUPROFEN 200 MG PO TABS
600.0000 mg | ORAL_TABLET | Freq: Once | ORAL | Status: AC
  Administered 2017-01-14: 600 mg via ORAL
  Filled 2017-01-14: qty 3
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
## 2017-02-18 NOTE — Progress Notes (Deleted)
  Subjective:    Patient ID: Marco Campbell, male    DOB: 1965/06/26, 51 y.o.   MRN: [id number]  Marco Campbell is a 51 y.o. male who presents for follow-up of Type 2 diabetes mellitus.  Patient {is/are not:32546} checking home blood sugars.   Home blood sugar records: {dm home sugars:14018} How often is blood sugars being checked: *** Current symptoms include: {dm sx:14075}. Patient denies {dm sx:19199}.  Patient {is/are not:32546} checking their feet daily. Any Foot concerns (callous, ulcer, wound, thickened nails, toenail fungus, skin fungus, hammer toe): *** Last dilated eye exam: ***  Current treatments: {dm interventions:14074}. Medication compliance: {good/fair/poor:33178}  Current diet: {diet habits:16563} Current exercise: {exercise types:16438} Known diabetic complications: {diabetes complications:1215}  The following portions of the patient's history were reviewed and updated as appropriate: allergies, current medications, past medical history, past social history and problem list.  ROS as in subjective above.     Objective:    Physical Exam Alert and in no distress otherwise not examined.  There were no vitals taken for this visit.  Lab Review Diabetic Labs Latest Ref Rng & Units 09/05/2016 07/10/2016 07/09/2016 07/09/2016  HbA1c - 8.5% - 13.1(H) -  Microalbumin Not estab mg/dL 1.3 - - -  Micro/Creat Ratio <30 mcg/mg creat 10 - - -  Chol <200 mg/dL 192 - - -  HDL >40 mg/dL 41 - - -  Calc LDL <100 mg/dL 126(H) - - -  Triglycerides <150 mg/dL 124 - - -  Creatinine 0.70 - 1.33 mg/dL 0.98 1.03 1.28(H) 1.33(H)   BP/Weight 01/14/2017 01/13/2017 09/05/2016 07/10/2016 07/31/7351  Systolic BP 299 - 242 683 -  Diastolic BP 419 - 90 81 -  Wt. (Lbs) - 265 256.8 - 249.12  BMI 34.96 - 33.88 - 32.87   No flowsheet data found.  [name]  reports that he has been smoking Cigarettes.  He has smoked for the past 30.00 years. He has never used smokeless tobacco. He reports that he  drinks alcohol. He reports that he does not use drugs.     Assessment & Plan:    Diabetes mellitus, new onset ([place])  1. Rx changes: {none:33079} 2. Education: Reviewed 'ABCs' of diabetes management (respective goals in parentheses):  A1C (<7), blood pressure (<130/80), and cholesterol (LDL <100). 3. Compliance at present is estimated to be {good/fair/poor:33178}. Efforts to improve compliance (if necessary) will be directed at {compliance:16716}. 4. Follow up: {NUMBERS; 0-10:33138} {time:11}

## 2017-02-19 ENCOUNTER — Ambulatory Visit: Payer: BC Managed Care – PPO | Admitting: Family Medicine

## 2017-02-28 ENCOUNTER — Ambulatory Visit: Payer: BC Managed Care – PPO | Admitting: Family Medicine

## 2017-03-03 ENCOUNTER — Other Ambulatory Visit: Payer: Self-pay | Admitting: Family Medicine

## 2017-03-03 DIAGNOSIS — IMO0001 Reserved for inherently not codable concepts without codable children: Secondary | ICD-10-CM

## 2017-03-03 DIAGNOSIS — E1165 Type 2 diabetes mellitus with hyperglycemia: Secondary | ICD-10-CM

## 2017-03-03 DIAGNOSIS — I10 Essential (primary) hypertension: Secondary | ICD-10-CM

## 2017-03-04 NOTE — Telephone Encounter (Signed)
Pt has an appointment tomorrow here. Pt should have 4 more days left of meds so we can refill this at appt

## 2017-03-05 ENCOUNTER — Ambulatory Visit (INDEPENDENT_AMBULATORY_CARE_PROVIDER_SITE_OTHER): Payer: BC Managed Care – PPO | Admitting: Family Medicine

## 2017-03-05 ENCOUNTER — Encounter: Payer: Self-pay | Admitting: Family Medicine

## 2017-03-05 VITALS — BP 150/90 | HR 87 | Wt 258.6 lb

## 2017-03-05 DIAGNOSIS — R9431 Abnormal electrocardiogram [ECG] [EKG]: Secondary | ICD-10-CM | POA: Diagnosis not present

## 2017-03-05 DIAGNOSIS — E119 Type 2 diabetes mellitus without complications: Secondary | ICD-10-CM

## 2017-03-05 DIAGNOSIS — I10 Essential (primary) hypertension: Secondary | ICD-10-CM | POA: Diagnosis not present

## 2017-03-05 DIAGNOSIS — E7800 Pure hypercholesterolemia, unspecified: Secondary | ICD-10-CM

## 2017-03-05 DIAGNOSIS — Z8249 Family history of ischemic heart disease and other diseases of the circulatory system: Secondary | ICD-10-CM | POA: Insufficient documentation

## 2017-03-05 DIAGNOSIS — E669 Obesity, unspecified: Secondary | ICD-10-CM | POA: Diagnosis not present

## 2017-03-05 DIAGNOSIS — F17200 Nicotine dependence, unspecified, uncomplicated: Secondary | ICD-10-CM | POA: Diagnosis not present

## 2017-03-05 DIAGNOSIS — E66811 Obesity, class 1: Secondary | ICD-10-CM | POA: Insufficient documentation

## 2017-03-05 HISTORY — DX: Pure hypercholesterolemia, unspecified: E78.00

## 2017-03-05 LAB — POCT GLYCOSYLATED HEMOGLOBIN (HGB A1C)

## 2017-03-05 MED ORDER — METFORMIN HCL 1000 MG PO TABS: 1000.0000 mg | ORAL_TABLET | Freq: Two times a day (BID) | ORAL | 1 refills | Status: DC

## 2017-03-05 MED ORDER — ATORVASTATIN CALCIUM 10 MG PO TABS: 10.0000 mg | ORAL_TABLET | Freq: Every day | ORAL | 1 refills | Status: DC

## 2017-03-05 MED ORDER — LISINOPRIL-HYDROCHLOROTHIAZIDE 10-12.5 MG PO TABS: 1.0000 | ORAL_TABLET | Freq: Every day | ORAL | 1 refills | Status: DC

## 2017-03-05 NOTE — Patient Instructions (Addendum)
I am increasing your Lisinopril from 5 mg to a combination Lisinopril/HCTZ. Start this tomorrow.  Make sure you are eating a low sodium diet. Increase your walking, slowly and gradually.  Start checking your BP outside of here daily.  Goal BP is <130/80.   Start taking cholesterol medication. I sent this to your pharmacy.   Stop smoking. This will help prevent against heart disease, vascular disease and other health conditions.   I am referring you to a cardiologist. You should receive a phone call soon to schedule this.   Follow up with me in 4 weeks.     DASH Eating Plan DASH stands for "Dietary Approaches to Stop Hypertension." The DASH eating plan is a healthy eating plan that has been shown to reduce high blood pressure (hypertension). It may also reduce your risk for type 2 diabetes, heart disease, and stroke. The DASH eating plan may also help with weight loss. What are tips for following this plan? General guidelines  Avoid eating more than 2,300 mg (milligrams) of salt (sodium) a day. If you have hypertension, you may need to reduce your sodium intake to 1,500 mg a day.  Limit alcohol intake to no more than 1 drink a day for nonpregnant women and 2 drinks a day for men. One drink equals 12 oz of beer, 5 oz of wine, or 1 oz of hard liquor.  Work with your health care provider to maintain a healthy body weight or to lose weight. Ask what an ideal weight is for you.  Get at least 30 minutes of exercise that causes your heart to beat faster (aerobic exercise) most days of the week. Activities may include walking, swimming, or biking.  Work with your health care provider or diet and nutrition specialist (dietitian) to adjust your eating plan to your individual calorie needs. Reading food labels  Check food labels for the amount of sodium per serving. Choose foods with less than 5 percent of the Daily Value of sodium. Generally, foods with less than 300 mg of sodium per serving fit  into this eating plan.  To find whole grains, look for the word "whole" as the first word in the ingredient list. Shopping  Buy products labeled as "low-sodium" or "no salt added."  Buy fresh foods. Avoid canned foods and premade or frozen meals. Cooking  Avoid adding salt when cooking. Use salt-free seasonings or herbs instead of table salt or sea salt. Check with your health care provider or pharmacist before using salt substitutes.  Do not fry foods. Cook foods using healthy methods such as baking, boiling, grilling, and broiling instead.  Cook with heart-healthy oils, such as olive, canola, soybean, or sunflower oil. Meal planning   Eat a balanced diet that includes: ? 5 or more servings of fruits and vegetables each day. At each meal, try to fill half of your plate with fruits and vegetables. ? Up to 6-8 servings of whole grains each day. ? Less than 6 oz of lean meat, poultry, or fish each day. A 3-oz serving of meat is about the same size as a deck of cards. One egg equals 1 oz. ? 2 servings of low-fat dairy each day. ? A serving of nuts, seeds, or beans 5 times each week. ? Heart-healthy fats. Healthy fats called Omega-3 fatty acids are found in foods such as flaxseeds and coldwater fish, like sardines, salmon, and mackerel.  Limit how much you eat of the following: ? Canned or prepackaged foods. ? Food  that is high in trans fat, such as fried foods. ? Food that is high in saturated fat, such as fatty meat. ? Sweets, desserts, sugary drinks, and other foods with added sugar. ? Full-fat dairy products.  Do not salt foods before eating.  Try to eat at least 2 vegetarian meals each week.  Eat more home-cooked food and less restaurant, buffet, and fast food.  When eating at a restaurant, ask that your food be prepared with less salt or no salt, if possible. What foods are recommended? The items listed may not be a complete list. Talk with your dietitian about what dietary  choices are best for you. Grains Whole-grain or whole-wheat bread. Whole-grain or whole-wheat pasta. Brown rice. [place]. Bulgur. Whole-grain and low-sodium cereals. Pita bread. Low-fat, low-sodium crackers. Whole-wheat flour tortillas. Vegetables Fresh or frozen vegetables (raw, steamed, roasted, or grilled). Low-sodium or reduced-sodium tomato and vegetable juice. Low-sodium or reduced-sodium tomato sauce and tomato paste. Low-sodium or reduced-sodium canned vegetables. Fruits All fresh, dried, or frozen fruit. Canned fruit in natural juice (without added sugar). Meat and other protein foods Skinless chicken or [place]. Ground chicken or [place]. Pork with fat trimmed off. Fish and seafood. Egg whites. Dried beans, peas, or lentils. Unsalted nuts, nut butters, and seeds. Unsalted canned beans. Lean cuts of beef with fat trimmed off. Low-sodium, lean deli meat. Dairy Low-fat (1%) or fat-free (skim) milk. Fat-free, low-fat, or reduced-fat cheeses. Nonfat, low-sodium ricotta or cottage cheese. Low-fat or nonfat yogurt. Low-fat, low-sodium cheese. Fats and oils Soft margarine without trans fats. Vegetable oil. Low-fat, reduced-fat, or light mayonnaise and salad dressings (reduced-sodium). Canola, safflower, olive, soybean, and sunflower oils. Avocado. Seasoning and other foods Herbs. Spices. Seasoning mixes without salt. Unsalted popcorn and pretzels. Fat-free sweets. What foods are not recommended? The items listed may not be a complete list. Talk with your dietitian about what dietary choices are best for you. Grains Baked goods made with fat, such as croissants, muffins, or some breads. Dry pasta or rice meal packs. Vegetables Creamed or fried vegetables. Vegetables in a cheese sauce. Regular canned vegetables (not low-sodium or reduced-sodium). Regular canned tomato sauce and paste (not low-sodium or reduced-sodium). Regular tomato and vegetable juice (not low-sodium or reduced-sodium).  Angie Fava. Olives. Fruits Canned fruit in a light or heavy syrup. Fried fruit. Fruit in cream or butter sauce. Meat and other protein foods Fatty cuts of meat. Ribs. Fried meat. Berniece Salines. Sausage. Bologna and other processed lunch meats. Salami. Fatback. Hotdogs. Bratwurst. Salted nuts and seeds. Canned beans with added salt. Canned or smoked fish. Whole eggs or egg yolks. Chicken or [place] with skin. Dairy Whole or 2% milk, cream, and half-and-half. Whole or full-fat cream cheese. Whole-fat or sweetened yogurt. Full-fat cheese. Nondairy creamers. Whipped toppings. Processed cheese and cheese spreads. Fats and oils Butter. Stick margarine. Lard. Shortening. Ghee. Bacon fat. Tropical oils, such as coconut, palm kernel, or palm oil. Seasoning and other foods Salted popcorn and pretzels. Onion salt, garlic salt, seasoned salt, table salt, and sea salt. Worcestershire sauce. Tartar sauce. Barbecue sauce. Teriyaki sauce. Soy sauce, including reduced-sodium. Steak sauce. Canned and packaged gravies. Fish sauce. Oyster sauce. Cocktail sauce. Horseradish that you find on the shelf. Ketchup. Mustard. Meat flavorings and tenderizers. Bouillon cubes. Hot sauce and Tabasco sauce. Premade or packaged marinades. Premade or packaged taco seasonings. Relishes. Regular salad dressings. Where to find more information:  National Heart, Lung, and [place]: https://wilson-eaton.com/  American Heart Association: www.heart.org Summary  The DASH eating plan is a  healthy eating plan that has been shown to reduce high blood pressure (hypertension). It may also reduce your risk for type 2 diabetes, heart disease, and stroke.  With the DASH eating plan, you should limit salt (sodium) intake to 2,300 mg a day. If you have hypertension, you may need to reduce your sodium intake to 1,500 mg a day.  When on the DASH eating plan, aim to eat more fresh fruits and vegetables, whole grains, lean proteins, low-fat dairy, and  heart-healthy fats.  Work with your health care provider or diet and nutrition specialist (dietitian) to adjust your eating plan to your individual calorie needs. This information is not intended to replace advice given to you by your health care provider. Make sure you discuss any questions you have with your health care provider. Document Released: 03/29/2011 Document Revised: 04/02/2016 Document Reviewed: 04/02/2016 Elsevier Interactive Patient Education  2017 [employer].

## 2017-03-05 NOTE — Progress Notes (Signed)
Subjective:    Patient ID: Marco Campbell, male    DOB: Feb 10, 1966, 51 y.o.   MRN: [id number]  Marco Campbell is a 51 y.o. male who presents for follow-up of Type 2 diabetes mellitus and other chronic health conditions. This is his second visit to our office.  Previous medical care: states Dr. Wallene Huh was his PCP and cardiologist.   He did not follow up as recommended in May 2018.   Newly diagnosed with DM type 2 in the ED in March 2018 with a Hgb A1c 13.1%. He saw me for the first time in May 2018 and his Hgb A1c had improved to 8.5%.   Patient is not checking home blood sugars.   Home blood sugar records: patient does not check sugars How often is blood sugars being checked: suppose to be checking twice a day Current symptoms include: none. Patient denies nausea, visual disturbances, vomiting and weight loss.  Patient is checking their feet daily. Any Foot concerns (callous, ulcer, wound, thickened nails, toenail fungus, skin fungus, hammer toe): none Last dilated eye exam: Had an appt with [facility] but forgot appt  Current treatments: Metformin 1,000 mg twice daily  Medication compliance: good  Current diet: in general, a "healthy" diet   Current exercise: walking Known diabetic complications: none   Denies history of HTN. However, upon review of his chart, he has multiple elevated BP readings. He has not been checking his BP at home.   The following portions of the patient's history were reviewed and updated as appropriate: allergies, current medications, past medical history, past social history and problem list.  ROS as in subjective above.     Objective:    Physical Exam Alert and in no distress.  Pharyngeal area is normal. Neck is supple without adenopathy or thyromegaly. Cardiac exam shows a regular sinus rhythm without murmurs or gallops. Lungs are clear to auscultation. Extremities without edema, pulses normal. PERRLA. CN II-IX grossly intact. DTRs normal  and symmetric.    Blood pressure (!) 150/90, pulse 87, weight 258 lb 9.6 oz (117.3 kg).  Lab Review Diabetic Labs Latest Ref Rng & Units 03/05/2017 09/05/2016 07/10/2016 07/09/2016 07/09/2016  HbA1c - 6.2% 8.5% - 13.1(H) -  Microalbumin Not estab mg/dL - 1.3 - - -  Micro/Creat Ratio <30 mcg/mg creat - 10 - - -  Chol <200 mg/dL - 192 - - -  HDL >40 mg/dL - 41 - - -  Calc LDL <100 mg/dL - 126(H) - - -  Triglycerides <150 mg/dL - 124 - - -  Creatinine 0.70 - 1.33 mg/dL - 0.98 1.03 1.28(H) 1.33(H)   BP/Weight 03/05/2017 01/14/2017 01/13/2017 09/05/2016 7/34/1937  Systolic BP 902 409 - 735 329  Diastolic BP 90 924 - 90 81  Wt. (Lbs) 258.6 - 265 256.8 -  BMI 34.12 34.96 - 33.88 -   No flowsheet data found.  Marco Campbell  reports that he has been smoking cigarettes.  He has smoked for the past 30.00 years. he has never used smokeless tobacco. He reports that he drinks alcohol. He reports that he does not use drugs.     Assessment & Plan:    Controlled type 2 diabetes mellitus without complication, without long-term current use of insulin ([place]) - Plan: HgB A1c, EKG 12-Lead, CBC with Differential/Platelet, COMPLETE METABOLIC PANEL WITH GFR, TSH, Lipid panel, metFORMIN (GLUCOPHAGE) 1000 MG tablet  Family history of heart disease - Plan: EKG 12-Lead, Ambulatory referral to Cardiology  Obesity (BMI 30-39.9) -  Plan: TSH, Lipid panel  Elevated LDL cholesterol level - Plan: Lipid panel, Ambulatory referral to Cardiology  Abnormal ECG - Plan: Ambulatory referral to Cardiology  Smoker  Uncontrolled hypertension - Plan: EKG 12-Lead, CBC with Differential/Platelet, COMPLETE METABOLIC PANEL WITH GFR, lisinopril-hydrochlorothiazide (PRINZIDE,ZESTORETIC) 10-12.5 MG tablet, Ambulatory referral to Cardiology  1. Rx changes: no changes to diabetes medication. HTN medication changes made A1c 6.2% congratulated him on Hb A1c reduction. He has made effective lifestyle changes and is taking his medication as  prescribed.  2. Education: Reviewed 'ABCs' of diabetes management (respective goals in parentheses):  A1C (<7), blood pressure (<130/80), and cholesterol (LDL <100). 3. Compliance at present is estimated to be good. Efforts to improve compliance (if necessary) will be directed at dietary modifications: DASH diet discussed, low sodium specifically  and increased exercise. 4. ECG abnormal. Left axis. Strong family history of heart disease. Discussed cardiac risk factors. Referral to cardiology.  5. Advised him to stop smoking.  6. Started him on a statin today. He is fasting. Will recheck lipids today.  7. Stop Lisinopril 5 mg and start taking Lisinopril 10 mg/HCTZ 12.5 mg.  8. Discussed importance of following up and keeping an eye on his BP at home.  9. Recommend he get an eye exam- has not had one since new diagnosis of diabetes.  10. Follow up: 1 month for uncontrolled HTN

## 2017-03-06 LAB — CBC WITH DIFFERENTIAL/PLATELET
BASOS ABS: 34 {cells}/uL (ref 0–200)
Basophils Relative: 0.4 %
EOS ABS: 68 {cells}/uL (ref 15–500)
Eosinophils Relative: 0.8 %
HEMATOCRIT: 46.3 % (ref 38.5–50.0)
HEMOGLOBIN: 15.6 g/dL (ref 13.2–17.1)
LYMPHS ABS: 2066 {cells}/uL (ref 850–3900)
MCH: 28.6 pg (ref 27.0–33.0)
MCHC: 33.7 g/dL (ref 32.0–36.0)
MCV: 84.8 fL (ref 80.0–100.0)
MPV: 9.3 fL (ref 7.5–12.5)
Monocytes Relative: 8.8 %
Neutro Abs: 5585 cells/uL (ref 1500–7800)
Neutrophils Relative %: 65.7 %
Platelets: 276 10*3/uL (ref 140–400)
RBC: 5.46 10*6/uL (ref 4.20–5.80)
RDW: 12.5 % (ref 11.0–15.0)
Total Lymphocyte: 24.3 %
WBC mixed population: 748 cells/uL (ref 200–950)
WBC: 8.5 10*3/uL (ref 3.8–10.8)

## 2017-03-06 LAB — COMPLETE METABOLIC PANEL WITH GFR
AG Ratio: 1.3 (calc) (ref 1.0–2.5)
ALBUMIN MSPROF: 3.9 g/dL (ref 3.6–5.1)
ALKALINE PHOSPHATASE (APISO): 53 U/L (ref 40–115)
ALT: 12 U/L (ref 9–46)
AST: 11 U/L (ref 10–35)
BILIRUBIN TOTAL: 0.4 mg/dL (ref 0.2–1.2)
BUN: 13 mg/dL (ref 7–25)
CHLORIDE: 106 mmol/L (ref 98–110)
CO2: 29 mmol/L (ref 20–32)
CREATININE: 0.98 mg/dL (ref 0.70–1.33)
Calcium: 8.9 mg/dL (ref 8.6–10.3)
GFR, Est African American: 103 mL/min/{1.73_m2} (ref >=60)
GFR, Est Non African American: 89 mL/min/{1.73_m2} (ref >=60)
GLOBULIN: 3.1 g/dL (ref 1.9–3.7)
Glucose, Bld: 101 mg/dL — ABNORMAL HIGH (ref 65–99)
Potassium: 4.3 mmol/L (ref 3.5–5.3)
SODIUM: 140 mmol/L (ref 135–146)
Total Protein: 7 g/dL (ref 6.1–8.1)

## 2017-03-06 LAB — LIPID PANEL
CHOL/HDL RATIO: 3.7 (calc) (ref <5.0)
CHOLESTEROL: 206 mg/dL — AB (ref <200)
HDL: 55 mg/dL (ref >40)
LDL Cholesterol (Calc): 133 mg/dL (calc) — ABNORMAL HIGH
Non-HDL Cholesterol (Calc): 151 mg/dL (calc) — ABNORMAL HIGH (ref <130)
Triglycerides: 84 mg/dL (ref <150)

## 2017-03-06 LAB — TSH: TSH: 0.97 mIU/L (ref 0.40–4.50)

## 2017-03-08 ENCOUNTER — Encounter: Payer: Self-pay | Admitting: Internal Medicine

## 2017-04-01 ENCOUNTER — Ambulatory Visit: Payer: BC Managed Care – PPO | Admitting: Family Medicine

## 2017-04-09 ENCOUNTER — Ambulatory Visit (INDEPENDENT_AMBULATORY_CARE_PROVIDER_SITE_OTHER): Payer: BC Managed Care – PPO | Admitting: Family Medicine

## 2017-04-09 ENCOUNTER — Encounter: Payer: Self-pay | Admitting: Family Medicine

## 2017-04-09 ENCOUNTER — Telehealth: Payer: Self-pay | Admitting: Family Medicine

## 2017-04-09 VITALS — BP 134/86 | HR 83 | Wt 258.4 lb

## 2017-04-09 DIAGNOSIS — E1169 Type 2 diabetes mellitus with other specified complication: Secondary | ICD-10-CM | POA: Insufficient documentation

## 2017-04-09 DIAGNOSIS — E669 Obesity, unspecified: Secondary | ICD-10-CM | POA: Diagnosis not present

## 2017-04-09 DIAGNOSIS — E119 Type 2 diabetes mellitus without complications: Secondary | ICD-10-CM | POA: Insufficient documentation

## 2017-04-09 DIAGNOSIS — F17200 Nicotine dependence, unspecified, uncomplicated: Secondary | ICD-10-CM

## 2017-04-09 DIAGNOSIS — M5441 Lumbago with sciatica, right side: Secondary | ICD-10-CM

## 2017-04-09 DIAGNOSIS — I10 Essential (primary) hypertension: Secondary | ICD-10-CM

## 2017-04-09 HISTORY — DX: Type 2 diabetes mellitus without complications: E11.9

## 2017-04-09 MED ORDER — CYCLOBENZAPRINE HCL 10 MG PO TABS: 10.0000 mg | ORAL_TABLET | Freq: Two times a day (BID) | ORAL | 0 refills | Status: DC | PRN

## 2017-04-09 MED ORDER — LISINOPRIL-HYDROCHLOROTHIAZIDE 20-12.5 MG PO TABS: 1.0000 | ORAL_TABLET | Freq: Every day | ORAL | 2 refills | Status: DC

## 2017-04-09 NOTE — Progress Notes (Signed)
Subjective:    Patient ID: Marco Campbell, male    DOB: 27-Jun-1965, 51 y.o.   MRN: [id number]  HPI Chief Complaint  Patient presents with  . Follow-up    follow up from dm    He is here for a 1 month follow up on uncontrolled HTN. We adjusted his BP medication at his last visit.  States he is not checking his BP at home and has not purchased a cuff yet.  Reports good medication compliance.  States he has made healthy changes to his diet and has cut back on sodium.  States he walks for exercise.  He was referred to cardiology for abnormal EKG and significant family history of heart disease but he has not yet scheduled a visit with them.  Denies chest pain, palpitations, shortness of breath, orthopnea, lower extremity edema.   He has not yet scheduled a diabetic eye exam  States he is not checking blood sugar at home.  States he cannot get his meter to work.  Started on statin 1 month ago.  Denies any problems with this medication.  He is still smoking.  States he stopped smoking in 2008 by using Chantix.  He was stopped for approximately 1 year before starting back in 2009.  States he is in the contemplation phase of stopping smoking again and he may want to try using Chantix again.  He will let me know.  He has a new concern today regarding right low back pain and "sciatica" flares.  States he has had 3-4 years of intermittent sciatica flares.  States recently this is been occurring more often and now he is having a once weekly flareup requiring medication.  States he is also doing stretching but this is no longer helping him. Requesting refill of Flexeril.  Denies numbness, tingling, weakness.  Reports pain is sharp and shoots from his right buttock down to his posterior right knee.  No loss of control of bowels or bladder. Denies fever, chills, nausea, vomiting.  Reviewed allergies, medications, past medical, surgical, and social history.   Review of Systems Pertinent positives and  negatives in the history of present illness.     Objective:   Physical Exam BP 134/86   Pulse 83   Wt 258 lb 6.4 oz (117.2 kg)   SpO2 95%   BMI 34.09 kg/m  Alert and in no distress. Neck is supple without adenopathy or thyromegaly. Cardiac exam shows a regular sinus rhythm without murmurs or gallops. Lungs are clear to auscultation. Lumbar spine with normal curvature, nontender, normal ROM. RLE is neurovascularly intact.  Positive straight leg test at 60 degrees.      Assessment & Plan:  Uncontrolled hypertension - Plan: lisinopril-hydrochlorothiazide (ZESTORETIC) 20-12.5 MG tablet  Acute right-sided low back pain with right-sided sciatica - Plan: DG Lumbar Spine Complete, Ambulatory referral to Physical Therapy  Obesity (BMI 30-39.9)  Smoker  Controlled type 2 diabetes mellitus without complication, without long-term current use of insulin ([place])  Discussed that his blood pressure is closer to goal but we will increase his lisinopril.  He appears to be doing well on current medication.  No side effects. Once again, I recommend that he buy a blood pressure cuff and start checking his blood pressures on a regular basis. I also recommend that he start checking blood sugars.  He does not seem to be able to use his meter at home so I have advised him to bring it by for nurse visit for further  education. Counseled on healthy diet and exercise for diabetes, hypertension and obesity. Recommend he call and schedule a diabetic eye exam. I also recommend that he call and schedule with the cardiologist. He will let me know when he is ready to stop smoking and we can try Chantix or discuss other options. Plan to send him for lumbar x-ray and physical therapy for intermittent flares of low back pain and sciatica.  Flexeril refilled for him. Follow-up in 6 weeks or sooner if blood pressure is not continuing to improve. Discussed that he should bring in blood pressure cuff, diabetic meter and all  of his readings to that visit. Spent at least 40 minutes face to face with patient and more than 50% was counseling and coordination of care.

## 2017-04-09 NOTE — Patient Instructions (Addendum)
As discussed, buy a blood pressure cuff and start checking your blood pressure on a regular basis. Goal BP range is <130/80.   Continue to cut back on sodium and fried or fatty foods.   I am increasing your blood pressure medication and I sent a new prescription to your pharmacy.  Come by for a nurse visit and bring in your diabetes meter for further education on checking your blood sugars. I recommend that you keep an eye on your blood sugars  Call and schedule diabetic eye exam.   Call and schedule with the cardiologist.   Call and let me know when you are ready to stop smoking and I will help you with this.  We will call you with your x-ray results and you will hear from physical therapy.  Follow-up with me in 6 weeks and bring in your blood pressure cuff, diabetes meter and all of your readings.

## 2017-04-09 NOTE — Telephone Encounter (Signed)
Called out Flexeril to [facility] [phone]

## 2017-04-18 ENCOUNTER — Telehealth: Payer: Self-pay

## 2017-04-18 NOTE — Telephone Encounter (Signed)
Thanks

## 2017-04-18 NOTE — Telephone Encounter (Signed)
We received a fax from [facility] they attempted to contact him 3 times and were unable to schedule him. I wanted you to be aware.  Thanks!

## 2017-04-29 ENCOUNTER — Telehealth: Payer: Self-pay | Admitting: Family Medicine

## 2017-04-29 MED ORDER — ATORVASTATIN CALCIUM 10 MG PO TABS: 10.0000 mg | ORAL_TABLET | Freq: Every day | ORAL | 0 refills | Status: DC

## 2017-04-29 NOTE — Telephone Encounter (Signed)
Rcvd refill request for Atorvastatin 10 mg #90

## 2017-04-29 NOTE — Telephone Encounter (Signed)
Done

## 2017-05-01 ENCOUNTER — Other Ambulatory Visit: Payer: Self-pay | Admitting: Family Medicine

## 2017-05-01 DIAGNOSIS — I10 Essential (primary) hypertension: Secondary | ICD-10-CM

## 2017-05-08 ENCOUNTER — Telehealth: Payer: Self-pay

## 2017-05-08 ENCOUNTER — Other Ambulatory Visit: Payer: Self-pay

## 2017-05-08 DIAGNOSIS — I10 Essential (primary) hypertension: Secondary | ICD-10-CM

## 2017-05-08 MED ORDER — LISINOPRIL-HYDROCHLOROTHIAZIDE 20-12.5 MG PO TABS: 1.0000 | ORAL_TABLET | Freq: Every day | ORAL | 2 refills | Status: DC

## 2017-05-08 NOTE — Telephone Encounter (Signed)
Prescription for lisinopril/ hctz 10/12.5  . Last ov is 02-21-17. Thanks [employer]

## 2017-05-08 NOTE — Telephone Encounter (Signed)
Sent to pharmacy 

## 2017-05-29 ENCOUNTER — Other Ambulatory Visit: Payer: Self-pay

## 2017-05-29 DIAGNOSIS — E119 Type 2 diabetes mellitus without complications: Secondary | ICD-10-CM

## 2017-05-29 MED ORDER — METFORMIN HCL 1000 MG PO TABS: 1000.0000 mg | ORAL_TABLET | Freq: Two times a day (BID) | ORAL | 1 refills | Status: DC

## 2017-07-28 ENCOUNTER — Other Ambulatory Visit: Payer: Self-pay | Admitting: Family Medicine

## 2017-08-15 ENCOUNTER — Other Ambulatory Visit: Payer: Self-pay | Admitting: Medical

## 2017-08-15 DIAGNOSIS — I10 Essential (primary) hypertension: Secondary | ICD-10-CM

## 2017-08-17 ENCOUNTER — Encounter (HOSPITAL_COMMUNITY): Payer: Self-pay | Admitting: Emergency Medicine

## 2017-08-17 ENCOUNTER — Other Ambulatory Visit: Payer: Self-pay

## 2017-08-17 ENCOUNTER — Emergency Department (HOSPITAL_COMMUNITY)
Admission: EM | Admit: 2017-08-17 | Discharge: 2017-08-17 | Disposition: A | Discharge status: Home / Self Care | Payer: BC Managed Care – PPO | Attending: Emergency Medicine | Admitting: Emergency Medicine

## 2017-08-17 DIAGNOSIS — Z79899 Other long term (current) drug therapy: Secondary | ICD-10-CM | POA: Diagnosis not present

## 2017-08-17 DIAGNOSIS — R222 Localized swelling, mass and lump, trunk: Secondary | ICD-10-CM | POA: Diagnosis not present

## 2017-08-17 DIAGNOSIS — I10 Essential (primary) hypertension: Secondary | ICD-10-CM | POA: Insufficient documentation

## 2017-08-17 DIAGNOSIS — E119 Type 2 diabetes mellitus without complications: Secondary | ICD-10-CM | POA: Insufficient documentation

## 2017-08-17 DIAGNOSIS — F17210 Nicotine dependence, cigarettes, uncomplicated: Secondary | ICD-10-CM | POA: Diagnosis not present

## 2017-08-17 DIAGNOSIS — Z7984 Long term (current) use of oral hypoglycemic drugs: Secondary | ICD-10-CM | POA: Insufficient documentation

## 2017-08-17 NOTE — ED Provider Notes (Signed)
[place] EMERGENCY DEPARTMENT Provider Note   CSN: [id number] Arrival date & time: 08/17/17  1359     History   Chief Complaint Chief Complaint  Patient presents with  . Abscess    HPI Marco Campbell is a 52 y.o. male medical history of hypertension, diabetes type 2, presenting with 2 weeks of small bumps noticed on his chest wall at the distal sternum.  He initially noticed a small bump and mild discomfort which started to slowly grow over the last 2 weeks.  It is nonpainful unless it is palpated.  He does not recall any trauma, break in the skin or insect bite. he denies any drainage, fever, chills, or other symptoms.  He has not tried anything for his symptoms prior to arrival.   HPI  Past Medical History:  Diagnosis Date  . Controlled type 2 diabetes mellitus without complication, without long-term current use of insulin ([facility]) 04/09/2017  . Diabetes mellitus without complication ([place])   . Hypertension     Patient Active Problem List   Diagnosis Date Noted  . Controlled type 2 diabetes mellitus without complication, without long-term current use of insulin ([facility] [facility]) 04/09/2017  . Elevated LDL cholesterol level 03/05/2017  . Obesity (BMI 30-39.9) 03/05/2017  . Smoker 03/05/2017  . Family history of heart disease 03/05/2017  . Oral thrush 07/09/2016  . Blurry vision 07/09/2016  . AKI (acute kidney injury) ([place]) 07/09/2016  . Uncontrolled hypertension     History reviewed. No pertinent surgical history.      Home Medications    Prior to Admission medications   Medication Sig Start Date End Date Taking? Authorizing Provider  atorvastatin (LIPITOR) 10 MG tablet TAKE 1 TABLET BY MOUTH EVERY DAY 07/29/17   Henson, Vickie L, NP-C  BAYER MICROLET LANCETS lancets by Other route. Pt was given this in office 09/05/16 pfm    [provider]  blood glucose meter kit and supplies Dispense based on patient and insurance preference. Use up to four times  daily as directed. (FOR ICD-9 250.00, 250.01). 07/10/16   Rosita Fire, MD  cyclobenzaprine (FLEXERIL) 10 MG tablet Take 1 tablet (10 mg total) by mouth 2 (two) times daily as needed for muscle spasms. 04/09/17   Henson, Vickie L, NP-C  ibuprofen (ADVIL,MOTRIN) 600 MG tablet Take 1 tablet (600 mg total) by mouth every 6 (six) hours as needed. 01/14/17   Charlann Lange, PA-C  lisinopril-hydrochlorothiazide (PRINZIDE,ZESTORETIC) 20-12.5 MG tablet TAKE 1 TABLET BY MOUTH EVERY DAY 08/16/17   Henson, Vickie L, NP-C  metFORMIN (GLUCOPHAGE) 1000 MG tablet Take 1 tablet (1,000 mg total) by mouth 2 (two) times daily with a meal. 05/29/17   Henson, Vickie L, NP-C  ONE TOUCH ULTRA TEST test strip  07/10/16   [provider]    Family History Family History  Problem Relation Age of Onset  . CAD Mother   . Diabetes Mother   . Heart failure Mother   . Diabetes Father   . Heart disease Father   . Heart attack Father 58  . Hypertension Brother   . Diabetes Brother   . Diabetes Maternal Grandmother   . Diabetes Sister     Social History Social History   Tobacco Use  . Smoking status: Current Every Day Smoker    Years: 30.00    Types: Cigarettes  . Smokeless tobacco: Never Used  Substance Use Topics  . Alcohol use: Yes    Comment: occasionally  . Drug use: No  Allergies   Bee venom   Review of Systems Review of Systems  Constitutional: Negative for chills, fatigue and fever.  Respiratory: Negative for chest tightness and shortness of breath.   Cardiovascular: Negative for chest pain and palpitations.  Gastrointestinal: Negative for nausea and vomiting.  Musculoskeletal: Negative for myalgias, neck pain and neck stiffness.  Skin: Positive for color change. Negative for pallor.       Please area of induration of the chest wall  Neurological: Negative for dizziness and light-headedness.     Physical Exam Updated Vital Signs BP 127/73 (BP Location: Right Arm)    Pulse 79   Temp 98.2 F (36.8 C) (Oral)   Resp 18   SpO2 99%   Physical Exam  Constitutional: He appears well-developed and well-nourished. No distress.  Well-appearing, nontoxic afebrile sitting comfortably in bed no acute distress.  HENT:  Head: Normocephalic and atraumatic.  Eyes: Conjunctivae are normal. Right eye exhibits no discharge. Left eye exhibits no discharge.  Neck: Normal range of motion.  Cardiovascular: Normal rate, regular rhythm and normal heart sounds.  No murmur heard. Pulmonary/Chest: Effort normal and breath sounds normal. No stridor. No respiratory distress. He has no wheezes. He has no rales.  Musculoskeletal: Normal range of motion.  Neurological: He is alert.  Skin: Skin is warm and dry. No rash noted. He is not diaphoretic. There is erythema. No pallor.  Approximately 1 cm raised area of induration over the sternum which is movable, no surrounding cellulitis, no central fluctuance, no purulence or drainage.  Mild focal erythema. uncomfortable to deep palpation.  Nontender to the touch.  Psychiatric: He has a normal mood and affect.  Nursing note and vitals reviewed.    ED Treatments / Results  Labs (all labs ordered are listed, but only abnormal results are displayed) Labs Reviewed - No data to display  EKG None  Radiology No results found.  Procedures Procedures (including critical care time)  Medications Ordered in ED Medications - No data to display   Initial Impression / Assessment and Plan / ED Course  I have reviewed the triage vital signs and the nursing notes.  Pertinent labs & imaging results that were available during my care of the patient were reviewed by me and considered in my medical decision making (see chart for details).    Patient presenting with a small mass of the chest wall without evidence of cellulitis, central fluctuance or concern for abscess. The area has grown slowly over the last 2 weeks and he denies any  systemic symptoms.  He is otherwise well and asymptomatic unless the area is firmly palpated.  Patient was provided with dermatology follow-up for further evaluation.  No abscess amenable to incision and drainage.  Will discharge home with close follow-up. Discussed strict return precautions and advised to return with any worsening or signs of infection and patient understood and agreed with plan.  Final Clinical Impressions(s) / ED Diagnoses   Final diagnoses:  Mass of chest wall    ED Discharge Orders    None       Dossie Der 08/17/17 [place], MD 08/18/17 [phone]

## 2017-08-17 NOTE — ED Triage Notes (Signed)
Pt states he has noticed an abscess to his chest for 2 weeks, no drainage. Small palpable mass noted to sternum.

## 2017-08-17 NOTE — Discharge Instructions (Signed)
As discussed, the mass is not an abscess amenable to incision and drainage and  there is no surrounding cellulitis that would require treatment with antibiotics.  The recommendation is to follow up with dermatology as it may require a biopsy and further evaluation, which would be done in an outpatient setting. Follow up with your Primary care provider. Return if symptoms worsen, redness spreads, the center becomes soft or purulent, fever, chills, nausea, vomiting or other new concerning symptoms in the meantime.

## 2017-09-18 ENCOUNTER — Encounter: Payer: Self-pay | Admitting: Family Medicine

## 2017-09-18 ENCOUNTER — Ambulatory Visit: Payer: BC Managed Care – PPO | Admitting: Family Medicine

## 2017-09-18 VITALS — BP 120/84 | HR 88 | Temp 99.1°F | Resp 18 | Ht 73.0 in | Wt 261.7 lb

## 2017-09-18 DIAGNOSIS — E119 Type 2 diabetes mellitus without complications: Secondary | ICD-10-CM | POA: Diagnosis not present

## 2017-09-18 DIAGNOSIS — E669 Obesity, unspecified: Secondary | ICD-10-CM | POA: Diagnosis not present

## 2017-09-18 DIAGNOSIS — Z79899 Other long term (current) drug therapy: Secondary | ICD-10-CM

## 2017-09-18 DIAGNOSIS — I10 Essential (primary) hypertension: Secondary | ICD-10-CM | POA: Diagnosis not present

## 2017-09-18 DIAGNOSIS — L02213 Cutaneous abscess of chest wall: Secondary | ICD-10-CM | POA: Diagnosis not present

## 2017-09-18 DIAGNOSIS — E7800 Pure hypercholesterolemia, unspecified: Secondary | ICD-10-CM | POA: Diagnosis not present

## 2017-09-18 DIAGNOSIS — Z111 Encounter for screening for respiratory tuberculosis: Secondary | ICD-10-CM | POA: Diagnosis not present

## 2017-09-18 DIAGNOSIS — Z23 Encounter for immunization: Secondary | ICD-10-CM | POA: Diagnosis not present

## 2017-09-18 DIAGNOSIS — F17200 Nicotine dependence, unspecified, uncomplicated: Secondary | ICD-10-CM | POA: Diagnosis not present

## 2017-09-18 LAB — POCT GLYCOSYLATED HEMOGLOBIN (HGB A1C): Hemoglobin A1C: 6.6 % — AB (ref 4.0–5.6)

## 2017-09-18 MED ORDER — CYCLOBENZAPRINE HCL 10 MG PO TABS: 10.0000 mg | ORAL_TABLET | Freq: Two times a day (BID) | ORAL | 0 refills | Status: DC | PRN

## 2017-09-18 NOTE — Patient Instructions (Addendum)
Your hemoglobin A1c is 6.6% today and your diabetes is well controlled.   Continue on the metformin as prescribed. Make sure you are limiting your carbohydrates and sugar intake and getting at least 150 minutes of physical activity per week.  Call and schedule your diabetic eye exam.  Call and schedule an appointment with the cardiologist as discussed.  I recommend that you stop smoking.  If you decide to do this, I would be happy to help.  Return Friday between 4 PM and 4:30 PM to have your TB skin test read.  We will call you with your lab results.  Return in 6 months for follow-up.  If you start seeing your blood pressure or blood sugars being elevated then return sooner.

## 2017-09-18 NOTE — Progress Notes (Signed)
Subjective:    Patient ID: Marco Campbell, male    DOB: Jun 01, 1965, 52 y.o.   MRN: [id number]  Marco Campbell is a 52 y.o. male who presents for follow-up of Type 2 diabetes mellitus and other chronic health conditions including hypertension, obesity, smoking and hyperlipidemia.  Upon entering the room patient was on his cell phone.  After giving him a few seconds to get off the phone he continued talking and showed no signs of ending his phone conversation.  I left the room for approximately 5 minutes before going back again.  I reminded him of our cell phone policy.  He has not followed up with a cardiologist as recommended.  He states he did receive a phone call but is not scheduled with them.  States he has been too busy with work. States he has not had a diabetic eye exam as recommended.  Reports he has been doing well except he was recently seen in the emergency department for an abscess on his chest wall.  States the area recently burst and drained.  Since then it has improved and no longer causing him issues but he would like to have this checked.  States he would also like a refill on the Flexeril for low back pain and sciatica.  Reports this flares up usually once a month or so.  He does not have any issues today.  States he is taking all of his medications as prescribed including statin.  Denies any side effects.  He is not checking his blood pressure at home.    He is still smoking and not ready to stop.  Patient is not checking home blood sugars.   Home blood sugar records: patient does not check sugars How often is blood sugars being checked: none Current symptoms include: none. Patient denies hypoglycemia , nausea, visual disturbances and vomiting.  Patient is checking their feet daily. Any Foot concerns (callous, ulcer, wound, thickened nails, toenail fungus, skin fungus, hammer toe): ingrown toenail  Last dilated eye exam: never   Current treatments: Metformin  . Medication compliance: good  Current diet: in general, a "healthy" diet   Current exercise: walking 4-6 miles  Known diabetic complications: none  The following portions of the patient's history were reviewed and updated as appropriate: allergies, current medications, past medical history, past social history and problem list.  ROS as in subjective above.     Objective:    Physical Exam Alert and in no distress.  1 cm x 0.5 cm area of firmness to anterior chest wall without induration or fluctuance.  No surrounding erythema.  No sign of infection.  Blood pressure 120/84, pulse 88, temperature 99.1 F (37.3 C), temperature source Tympanic, resp. rate 18, height 6\' 1"  (1.854 m), weight 261 lb 11.2 oz (118.7 kg).  Lab Review Diabetic Labs Latest Ref Rng & Units 09/18/2017 03/05/2017 09/05/2016 07/10/2016 07/09/2016  HbA1c 4.0 - 5.6 % 6.6(A) 6.2% 8.5% - 13.1(H)  Microalbumin Not estab mg/dL - - 1.3 - -  Micro/Creat Ratio <30 mcg/mg creat - - 10 - -  Chol <200 mg/dL - 206(H) 192 - -  HDL >40 mg/dL - 55 41 - -  Calc LDL mg/dL (calc) - 133(H) 126(H) - -  Triglycerides <150 mg/dL - 84 124 - -  Creatinine 0.70 - 1.33 mg/dL - 0.98 0.98 1.03 1.28(H)   BP/Weight 09/18/2017 08/17/2017 04/09/2017 03/05/2017 07/30/8117  Systolic BP 147 829 562 130 865  Diastolic BP 84 73 86 90 784  Wt. (Lbs) 261.7 - 258.4 258.6 -  BMI 34.53 - 34.09 34.12 34.96   No flowsheet data found.  Marco Campbell  reports that he has been smoking cigarettes.  He has smoked for the past 30.00 years. He has never used smokeless tobacco. He reports that he drinks alcohol. He reports that he does not use drugs.     Assessment & Plan:    Controlled type 2 diabetes mellitus without complication, without long-term current use of insulin ([facility]) - Plan: HgB W1U, Basic metabolic panel  Uncontrolled hypertension - Plan: Basic metabolic panel  Elevated LDL cholesterol level - Plan: Lipid panel  Obesity (BMI 30-39.9)  Smoker  Need  for Tdap vaccination  Screening examination for pulmonary tuberculosis - Plan: PPD  Abscess of chest wall  Medication management - Plan: Lipid panel  1. Rx changes: none Hgb A1c 6.6%  2. Education: Reviewed 'ABCs' of diabetes management (respective goals in parentheses):  A1C (<7), blood pressure (<130/80), and cholesterol (LDL <100). 3. Compliance at present is estimated to be good. Efforts to improve compliance (if necessary) will be directed at dietary modifications: Cut back on carbohydrates and sugar, increased exercise and regular blood sugar monitoring: daily or at least a couple of times per week.  He does not want to check his blood sugar at home. 4. Blood pressure appears to be well controlled on current dose of medication.  He is not checking his blood pressure at home. 5. Discussed risk factors for heart disease including diabetes, hypertension, hyperlipidemia, smoking and obesity.  He also has a strong family history of heart disease.  He has not made appointment with a cardiologist as I recommended.  Strongly encouraged him to do so. 6. Encouraged him to take his health more seriously 7. Advised that he get a diabetic eye exam, he has not yet done this. 8. Continue on statin and check fasting lipids today. 9. Advised that smoking increases his risk of worsening health and if he decides that he would like to stop I would be happy to help him with this. 10. Discussed that the abscess is healing, no signs of infection.  Advised that he use warm compresses and let me know if this flares up again. 11. Tdap given.  Counseling done on all components of this vaccine. 12. PPD placed and advised that he needs to return on Friday between 4 and 4:30 PM. 13. Follow up: 6 months

## 2017-09-19 ENCOUNTER — Other Ambulatory Visit: Payer: Self-pay | Admitting: Family Medicine

## 2017-09-19 LAB — BASIC METABOLIC PANEL
BUN / CREAT RATIO: 15 (ref 9–20)
BUN: 15 mg/dL (ref 6–24)
CO2: 24 mmol/L (ref 20–29)
Calcium: 9.3 mg/dL (ref 8.7–10.2)
Chloride: 102 mmol/L (ref 96–106)
Creatinine, Ser: 1.01 mg/dL (ref 0.76–1.27)
GFR calc Af Amer: 99 mL/min/{1.73_m2} (ref >59)
GFR, EST NON AFRICAN AMERICAN: 86 mL/min/{1.73_m2} (ref >59)
Glucose: 89 mg/dL (ref 65–99)
POTASSIUM: 3.9 mmol/L (ref 3.5–5.2)
SODIUM: 141 mmol/L (ref 134–144)

## 2017-09-19 LAB — LIPID PANEL
Chol/HDL Ratio: 4 ratio (ref 0.0–5.0)
Cholesterol, Total: 186 mg/dL (ref 100–199)
HDL: 47 mg/dL (ref >39)
LDL Calculated: 118 mg/dL — ABNORMAL HIGH (ref 0–99)
Triglycerides: 107 mg/dL (ref 0–149)
VLDL Cholesterol Cal: 21 mg/dL (ref 5–40)

## 2017-09-19 MED ORDER — ATORVASTATIN CALCIUM 20 MG PO TABS: 20.0000 mg | ORAL_TABLET | Freq: Every day | ORAL | 3 refills | Status: DC

## 2017-09-20 LAB — TB SKIN TEST
Induration: 0 mm
TB SKIN TEST: NEGATIVE

## 2017-11-06 NOTE — Progress Notes (Signed)
Cardiology Office Note   Date:  11/07/2017   ID:  Marco Campbell, DOB 04-01-1966, MRN [id number]  PCP:  Marco Rm, NP-C  Cardiologist:   No primary care provider on file. Referring:  Marco Rm, NP-C  Chief Complaint  Patient presents with  . Sleep Apnea      History of Present Illness: Marco Campbell is a 52 y.o. male who was referred by Marco Rm, NP-C for evaluation of a strong family history of cardiomyopathy and coronary artery disease.  The patient is never had any cardiac issues himself.  I did see that he had a stress test some years ago with a slightly reduced ejection fraction of 53% but no evidence of ischemia.  He might have some mild dyspnea after activities.  However, he gets along fairly well.  He does walking with his job and he denies any chest pressure, neck or arm discomfort.  He does not have any palpitations, presyncope or syncope.  He does not have PND or orthopnea.  Multiple family members first-degree have had cardiomyopathy.  His father had bypass surgery with his heart disease starting in the mid 20s.  He had bypass twice.   Past Medical History:  Diagnosis Date  . Controlled type 2 diabetes mellitus without complication, without long-term current use of insulin ([facility]) 04/09/2017  . Diabetes mellitus without complication ([facility])   . Hypertension   . Sleep apnea    Does not use CPAP    History reviewed. No pertinent surgical history.   Current Outpatient Medications  Medication Sig Dispense Refill  . atorvastatin (LIPITOR) 20 MG tablet Take 1 tablet (20 mg total) by mouth daily. 30 tablet 3  . BAYER MICROLET LANCETS lancets by Other route. Pt was given this in office 09/05/16 pfm    . blood glucose meter kit and supplies Dispense based on patient and insurance preference. Use up to four times daily as directed. (FOR ICD-9 250.00, 250.01). 1 each 0  . cyclobenzaprine (FLEXERIL) 10 MG tablet Take 1 tablet (10 mg total) by mouth 2 (two)  times daily as needed for muscle spasms. 20 tablet 0  . ibuprofen (ADVIL,MOTRIN) 600 MG tablet Take 1 tablet (600 mg total) by mouth every 6 (six) hours as needed. 30 tablet 0  . lisinopril-hydrochlorothiazide (PRINZIDE,ZESTORETIC) 20-12.5 MG tablet TAKE 1 TABLET BY MOUTH EVERY DAY 30 tablet 2  . metFORMIN (GLUCOPHAGE) 1000 MG tablet Take 1 tablet (1,000 mg total) by mouth 2 (two) times daily with a meal. 180 tablet 1  . ONE TOUCH ULTRA TEST test strip      No current facility-administered medications for this visit.     Allergies:   Bee venom    Social History:  The patient  reports that he has been smoking cigarettes.  He has smoked for the past 30.00 years. He has never used smokeless tobacco. He reports that he drinks alcohol. He reports that he does not use drugs.   Family History:  The patient's family history includes Diabetes in his brother, father, maternal grandmother, mother, and sister; Heart attack (age of onset: 74) in his father; Heart disease in his father; Heart failure in his mother; Heart failure (age of onset: 60) in his brother and sister; Hypertension in his brother.    ROS:  Please see the history of present illness.   Otherwise, review of systems are positive for none.   All other systems are reviewed and negative.    PHYSICAL  EXAM: VS:  BP 122/68 (BP Location: Left Arm, Patient Position: Sitting, Cuff Size: Large)   Pulse 88   Ht 6' 1"  (1.854 m)   Wt 263 lb (119.3 kg)   BMI 34.70 kg/m  , BMI Body mass index is 34.7 kg/m. GENERAL:  Well appearing HEENT:  Pupils equal round and reactive, fundi not visualized, oral mucosa unremarkable NECK:  No jugular venous distention, waveform within Marco limits, carotid upstroke brisk and symmetric, no bruits, no thyromegaly LYMPHATICS:  No cervical, inguinal adenopathy LUNGS:  Clear to auscultation bilaterally BACK:  No CVA tenderness CHEST:  Unremarkable HEART:  PMI not displaced or sustained,S1 and S2 within Marco  limits, no S3, no S4, no clicks, no rubs, no murmurs ABD:  Flat, positive bowel sounds Marco in frequency in pitch, no bruits, no rebound, no guarding, no midline pulsatile mass, no hepatomegaly, no splenomegaly EXT:  2 plus pulses throughout, no edema, no cyanosis no clubbing SKIN:  No rashes no nodules NEURO:  Cranial nerves II through XII grossly intact, motor grossly intact throughout PSYCH:  Cognitively intact, oriented to person place and time    EKG:  EKG is ordered today. The ekg ordered today demonstrates sinus rhythm, rate 88, axis within Marco limits, intervals within Marco limits, low voltage in the limb leads so that does not matter that I marked   Recent Labs: 03/05/2017: ALT 12; Hemoglobin 15.6; Platelets 276; TSH 0.97 09/18/2017: BUN 15; Creatinine, Ser 1.01; Potassium 3.9; Sodium 141    Lipid Panel    Component Value Date/Time   CHOL 186 09/18/2017 1646   TRIG 107 09/18/2017 1646   HDL 47 09/18/2017 1646   CHOLHDL 4.0 09/18/2017 1646   CHOLHDL 3.7 03/05/2017 1050   VLDL 25 09/05/2016 1100   LDLCALC 118 (H) 09/18/2017 1646   LDLCALC 133 (H) 03/05/2017 1050      Wt Readings from Last 3 Encounters:  11/07/17 263 lb (119.3 kg)  09/18/17 261 lb 11.2 oz (118.7 kg)  04/09/17 258 lb 6.4 oz (117.2 kg)      Other studies Reviewed: Additional studies/ records that were reviewed today include: Labs. Review of the above records demonstrates:  Please see elsewhere in the note.     ASSESSMENT AND PLAN:  FAMILY HISTORY OF CM: The patient has a strong family history of multiple first-degree relatives having cardiomyopathy.  He needs to be screened with an echocardiogram and then further plans including possible genetic testing based on this.  FAMILY HISTORY OF CAD: He has a strong family history of early coronary artery disease.  I will start with a coronary calcium score.  SLEEP APNEA:  He never was able to use the CPAP and is interested in being tested again.  I  will refer him to Dr. Claiborne Billings.     Current medicines are reviewed at length with the patient today.  The patient does not have concerns regarding medicines.  The following changes have been made:  no change  Labs/ tests ordered today include:   Orders Placed This Encounter  Procedures  . CT CARDIAC SCORING  . EKG 12-Lead  . ECHOCARDIOGRAM COMPLETE     Disposition:   FU with me based on the results of the above.     Signed, Minus Breeding, MD  11/07/2017 4:36 PM    [facility]

## 2017-11-07 ENCOUNTER — Encounter: Payer: Self-pay | Admitting: Cardiology

## 2017-11-07 ENCOUNTER — Ambulatory Visit: Payer: BC Managed Care – PPO | Admitting: Cardiology

## 2017-11-07 VITALS — BP 122/68 | HR 88 | Ht 73.0 in | Wt 263.0 lb

## 2017-11-07 DIAGNOSIS — Z8249 Family history of ischemic heart disease and other diseases of the circulatory system: Secondary | ICD-10-CM | POA: Diagnosis not present

## 2017-11-07 DIAGNOSIS — G4730 Sleep apnea, unspecified: Secondary | ICD-10-CM | POA: Diagnosis not present

## 2017-11-07 DIAGNOSIS — R0602 Shortness of breath: Secondary | ICD-10-CM

## 2017-11-07 HISTORY — DX: Shortness of breath: R06.02

## 2017-11-07 NOTE — Patient Instructions (Signed)
Medication Instructions:  Continue current medications  If you need a refill on your cardiac medications before your next appointment, please call your pharmacy.  Labwork: None Ordered   Testing/Procedures: Your physician has requested that you have an echocardiogram. Echocardiography is a painless test that uses sound waves to create images of your heart. It provides your doctor with information about the size and shape of your heart and how well your heart's chambers and valves are working. This procedure takes approximately one hour. There are no restrictions for this procedure.  Your physician has requested that you have a Coronary Calcium score test. This test is done at our [place]: Your physician recommends that you schedule a follow-up appointment in: Dr Claiborne Billings for Sleep  Your physician wants you to follow-up in: 1 Year. You should receive a reminder letter in the mail two months in advance. If you do not receive a letter, please call our office [phone].      Thank you for choosing [facility] at [facility]!!

## 2017-11-11 LAB — HM DIABETES EYE EXAM

## 2017-11-16 ENCOUNTER — Other Ambulatory Visit: Payer: Self-pay | Admitting: Family Medicine

## 2017-11-16 DIAGNOSIS — I10 Essential (primary) hypertension: Secondary | ICD-10-CM

## 2017-11-19 ENCOUNTER — Inpatient Hospital Stay: Admission: RE | Admit: 2017-11-19 | Payer: Self-pay | Source: Ambulatory Visit

## 2017-11-19 ENCOUNTER — Other Ambulatory Visit (HOSPITAL_COMMUNITY): Payer: Self-pay

## 2017-11-20 ENCOUNTER — Other Ambulatory Visit: Payer: Self-pay | Admitting: Family Medicine

## 2017-12-03 ENCOUNTER — Encounter: Payer: Self-pay | Admitting: Internal Medicine

## 2017-12-06 ENCOUNTER — Ambulatory Visit (HOSPITAL_COMMUNITY): Payer: BC Managed Care – PPO | Attending: Cardiology

## 2017-12-06 ENCOUNTER — Inpatient Hospital Stay: Admission: RE | Admit: 2017-12-06 | Payer: Self-pay | Source: Ambulatory Visit

## 2017-12-06 DIAGNOSIS — R0989 Other specified symptoms and signs involving the circulatory and respiratory systems: Secondary | ICD-10-CM

## 2018-01-01 ENCOUNTER — Encounter (HOSPITAL_COMMUNITY): Payer: Self-pay | Admitting: Radiology

## 2018-02-04 ENCOUNTER — Ambulatory Visit: Payer: BC Managed Care – PPO | Admitting: Cardiovascular Disease

## 2018-02-04 DIAGNOSIS — R0989 Other specified symptoms and signs involving the circulatory and respiratory systems: Secondary | ICD-10-CM

## 2018-02-05 ENCOUNTER — Encounter: Payer: Self-pay | Admitting: *Deleted

## 2018-02-14 ENCOUNTER — Other Ambulatory Visit: Payer: Self-pay | Admitting: Family Medicine

## 2018-02-14 DIAGNOSIS — E119 Type 2 diabetes mellitus without complications: Secondary | ICD-10-CM

## 2018-03-15 ENCOUNTER — Other Ambulatory Visit: Payer: Self-pay | Admitting: Family Medicine

## 2018-03-24 ENCOUNTER — Ambulatory Visit: Payer: Self-pay | Admitting: Family Medicine

## 2018-04-07 ENCOUNTER — Ambulatory Visit (INDEPENDENT_AMBULATORY_CARE_PROVIDER_SITE_OTHER): Payer: BC Managed Care – PPO | Admitting: Family Medicine

## 2018-04-07 ENCOUNTER — Encounter: Payer: Self-pay | Admitting: Family Medicine

## 2018-04-07 VITALS — BP 130/80 | HR 85 | Wt 257.4 lb

## 2018-04-07 DIAGNOSIS — F17200 Nicotine dependence, unspecified, uncomplicated: Secondary | ICD-10-CM | POA: Diagnosis not present

## 2018-04-07 DIAGNOSIS — I152 Hypertension secondary to endocrine disorders: Secondary | ICD-10-CM

## 2018-04-07 DIAGNOSIS — I10 Essential (primary) hypertension: Secondary | ICD-10-CM

## 2018-04-07 DIAGNOSIS — E119 Type 2 diabetes mellitus without complications: Secondary | ICD-10-CM | POA: Diagnosis not present

## 2018-04-07 DIAGNOSIS — Z1211 Encounter for screening for malignant neoplasm of colon: Secondary | ICD-10-CM

## 2018-04-07 DIAGNOSIS — E1159 Type 2 diabetes mellitus with other circulatory complications: Secondary | ICD-10-CM | POA: Diagnosis not present

## 2018-04-07 LAB — POCT GLYCOSYLATED HEMOGLOBIN (HGB A1C): Hemoglobin A1C: 6.3 % — AB (ref 4.0–5.6)

## 2018-04-07 MED ORDER — METFORMIN HCL ER 750 MG PO TB24: 750.0000 mg | ORAL_TABLET | Freq: Every day | ORAL | 2 refills | Status: DC

## 2018-04-07 MED ORDER — ATORVASTATIN CALCIUM 20 MG PO TABS: 20.0000 mg | ORAL_TABLET | Freq: Every day | ORAL | 1 refills | Status: DC

## 2018-04-07 MED ORDER — LISINOPRIL-HYDROCHLOROTHIAZIDE 20-12.5 MG PO TABS: 1.0000 | ORAL_TABLET | Freq: Every day | ORAL | 1 refills | Status: DC

## 2018-04-07 NOTE — Patient Instructions (Signed)
Your hemoglobin A1c is 6.3% today and your diabetes is well controlled.  Stop the metformin and start Glucophage ER.  Let's see if your GI symptoms improve.  Your blood pressure is 130/80 and well controlled.  Continue on your current medications for this.  Continue on the statin.  You will receive a phone call from the GI office to schedule an appointment to discuss screening colonoscopy  I recommend that you return in 3 to 4 months for a fasting complete physical exam.  We will also follow-up on your chronic health conditions at this visit.

## 2018-04-07 NOTE — Progress Notes (Signed)
   Subjective:    Patient ID: Marco Campbell, male    DOB: 06/14/1965, 52 y.o.   MRN: [id number]  HPI Chief Complaint  Patient presents with  . 6 month follow-up    6 month follow-up on meds and DM meds- needs refills on all meds   He is here for a 6 month medication management visit and to follow-up on type 2 diabetes, hypertension, obesity, smoking and hyperlipidemia.  He requests a referral to GI for a screening colonoscopy.  He has never had one.  Reports good medication compliance. No side effects.  Does not check blood sugars or BP at home.   Currently taking metformin 1000 mg twice daily. States he is having loose stools even diarrhea at times.   HTN-taking lisinopril/hydrochlorothiazide  Reports taking daily atorvastatin 20 mg  Sleep apnea-his cardiologist referred him to Dr. Claiborne Billings to test for sleep apnea.  States he was never able to use the CPAP. Has not followed up as recommended.   He did see the cardiologist for a consult due to strong family history of multiple first-degree relatives, cardiomyopathy and family history of CAD. Has not had recommended testing including echocardiogram, CT cardiac scoring Or genetic testing  Denies fever, chills, dizziness, chest pain, palpitations, shortness of breath, abdominal pain, N/V/D, urinary symptoms, LE edema.   Reviewed allergies, medications, past medical, surgical, family, and social history.    Review of Systems Pertinent positives and negatives in the history of present illness.     Objective:   Physical Exam BP 130/80   Pulse 85   Wt 257 lb 6.4 oz (116.8 kg)   BMI 33.96 kg/m   Alert and oriented and in no acute distress. Not otherwise examined.       Assessment & Plan:  Controlled type 2 diabetes mellitus without complication, without long-term current use of insulin ([place]) - Plan: HgB A1c, CBC with Differential/Platelet, Comprehensive metabolic panel  Screening for colon cancer - Plan: Ambulatory referral  to Gastroenterology  Smoker  Hypertension associated with diabetes ([place]) - Plan: CBC with Differential/Platelet, Comprehensive metabolic panel  Uncontrolled hypertension - Plan: lisinopril-hydrochlorothiazide (PRINZIDE,ZESTORETIC) 20-12.5 MG tablet  Discussed that his hemoglobin A1c is 6.3% and diabetes is well controlled.  He does appear to be having some GI side effects to the metformin.  Plan to switch him to Glucophage ER. He is not interested in checking blood sugars at home. Hypertension-blood pressure appears to be well controlled.  Continue on current medications counseling done on healthy diet with low sodium.  He is not currently exercising on a regular basis He is still smoking Requests referral to GI for screening colonoscopy.  This was done. Check labs and follow-up.  He is not fasting Recommend that he return in 3 to 4 months for a fasting CPE.

## 2018-04-08 LAB — COMPREHENSIVE METABOLIC PANEL
ALBUMIN: 4.2 g/dL (ref 3.5–5.5)
ALK PHOS: 65 IU/L (ref 39–117)
ALT: 13 IU/L (ref 0–44)
AST: 13 IU/L (ref 0–40)
Albumin/Globulin Ratio: 1.4 (ref 1.2–2.2)
BUN / CREAT RATIO: 15 (ref 9–20)
BUN: 17 mg/dL (ref 6–24)
Bilirubin Total: <0.2 mg/dL (ref 0.0–1.2)
CO2: 22 mmol/L (ref 20–29)
CREATININE: 1.16 mg/dL (ref 0.76–1.27)
Calcium: 9.4 mg/dL (ref 8.7–10.2)
Chloride: 105 mmol/L (ref 96–106)
GFR calc non Af Amer: 72 mL/min/{1.73_m2} (ref >59)
GFR, EST AFRICAN AMERICAN: 83 mL/min/{1.73_m2} (ref >59)
GLUCOSE: 85 mg/dL (ref 65–99)
Globulin, Total: 2.9 g/dL (ref 1.5–4.5)
Potassium: 4.1 mmol/L (ref 3.5–5.2)
Sodium: 142 mmol/L (ref 134–144)
TOTAL PROTEIN: 7.1 g/dL (ref 6.0–8.5)

## 2018-04-08 LAB — CBC WITH DIFFERENTIAL/PLATELET
BASOS ABS: 0 10*3/uL (ref 0.0–0.2)
Basos: 0 %
EOS (ABSOLUTE): 0.1 10*3/uL (ref 0.0–0.4)
Eos: 1 %
HEMOGLOBIN: 14.9 g/dL (ref 13.0–17.7)
Hematocrit: 45.4 % (ref 37.5–51.0)
IMMATURE GRANS (ABS): 0 10*3/uL (ref 0.0–0.1)
Immature Granulocytes: 0 %
LYMPHS: 30 %
Lymphocytes Absolute: 3.4 10*3/uL — ABNORMAL HIGH (ref 0.7–3.1)
MCH: 27.9 pg (ref 26.6–33.0)
MCHC: 32.8 g/dL (ref 31.5–35.7)
MCV: 85 fL (ref 79–97)
MONOCYTES: 10 %
Monocytes Absolute: 1.1 10*3/uL — ABNORMAL HIGH (ref 0.1–0.9)
Neutrophils Absolute: 6.6 10*3/uL (ref 1.4–7.0)
Neutrophils: 59 %
Platelets: 313 10*3/uL (ref 150–450)
RBC: 5.34 x10E6/uL (ref 4.14–5.80)
RDW: 13 % (ref 12.3–15.4)
WBC: 11.3 10*3/uL — AB (ref 3.4–10.8)

## 2018-04-08 LAB — SPECIMEN STATUS REPORT

## 2018-05-12 ENCOUNTER — Encounter: Payer: Self-pay | Admitting: Internal Medicine

## 2018-05-16 IMAGING — DX DG CHEST 2V
2 series · 2 of 2 positions shown · non-contrast
Comparison: Radiographs June 08, 2010.

CLINICAL DATA: Cough.

EXAM:
CHEST  2 VIEW

[chest pa]
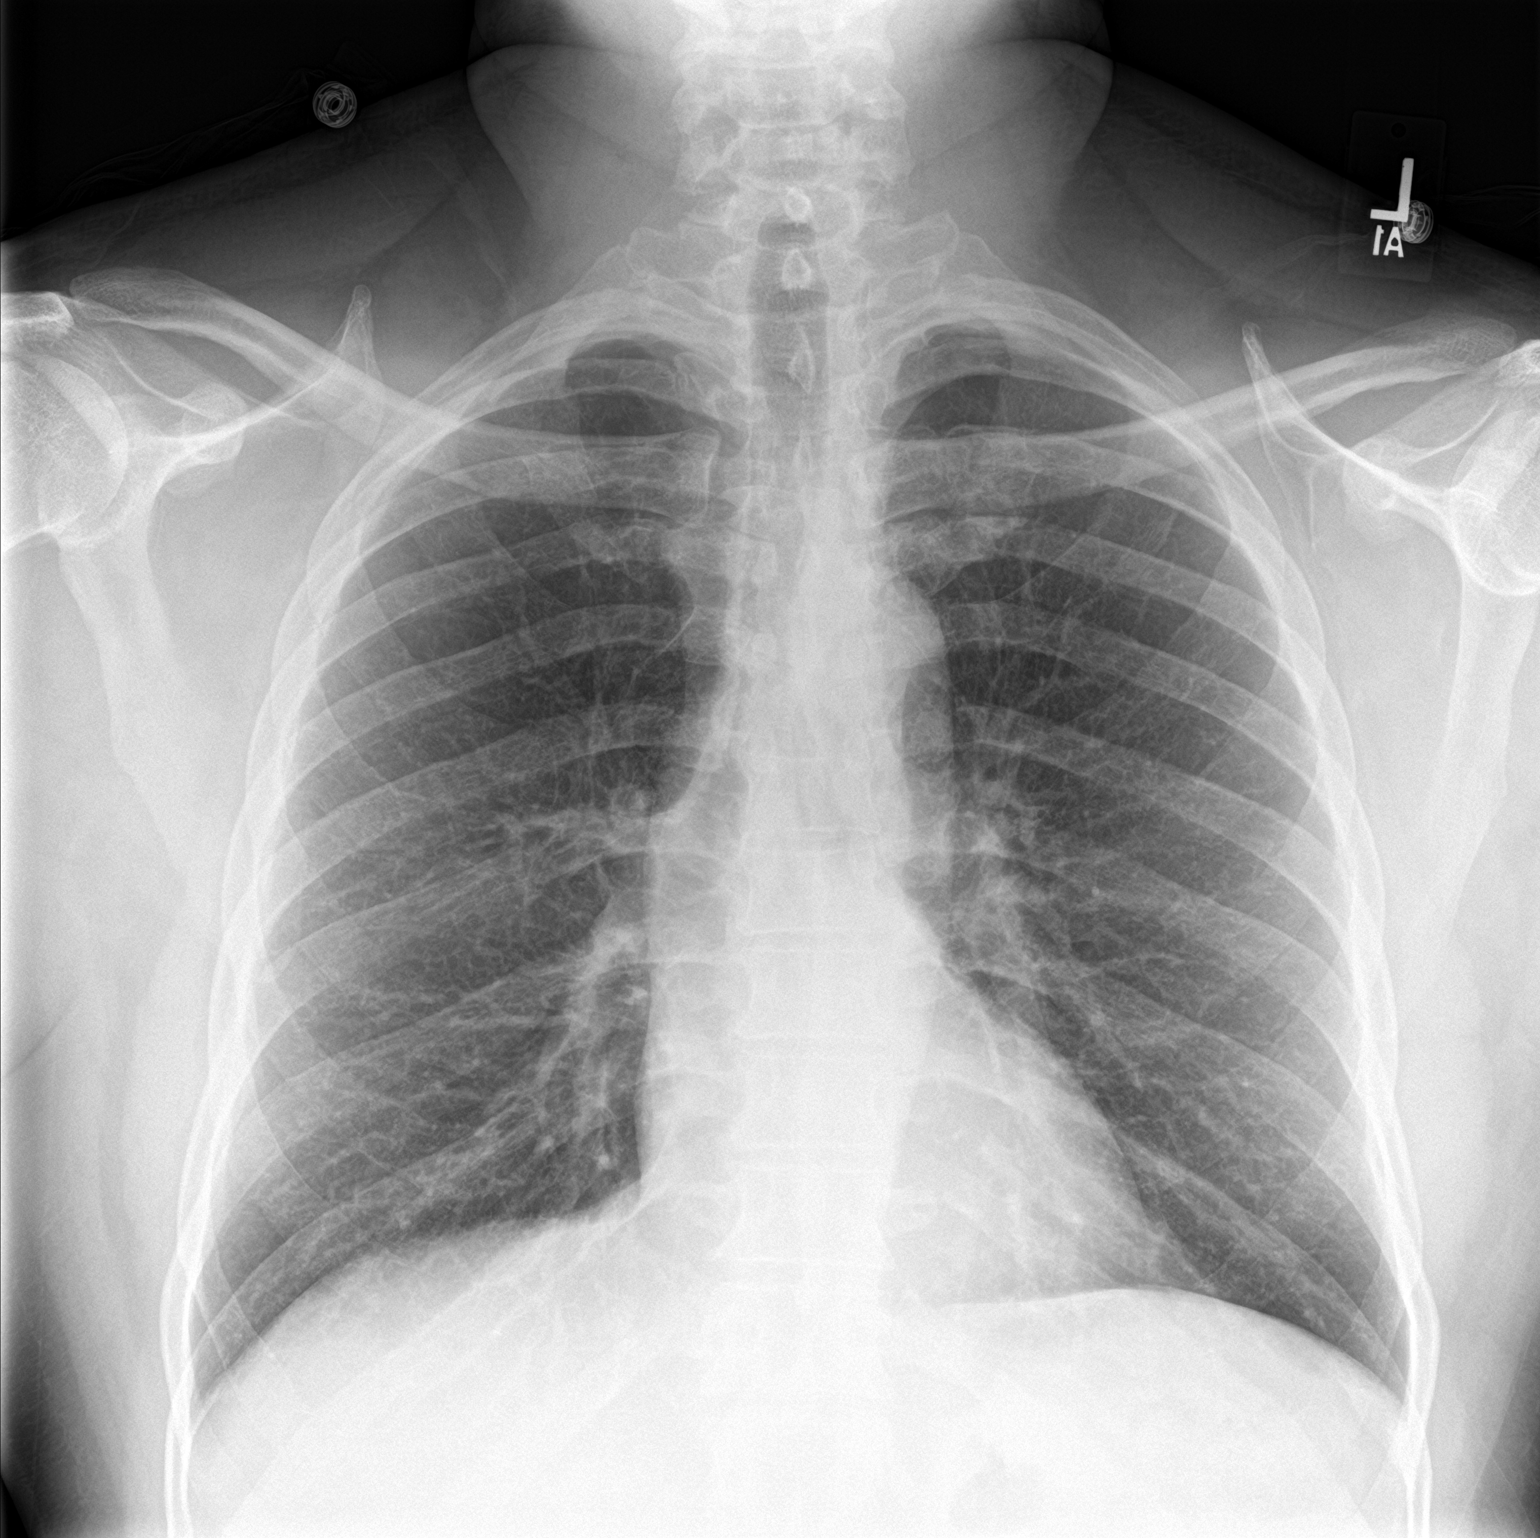

[chest lat]
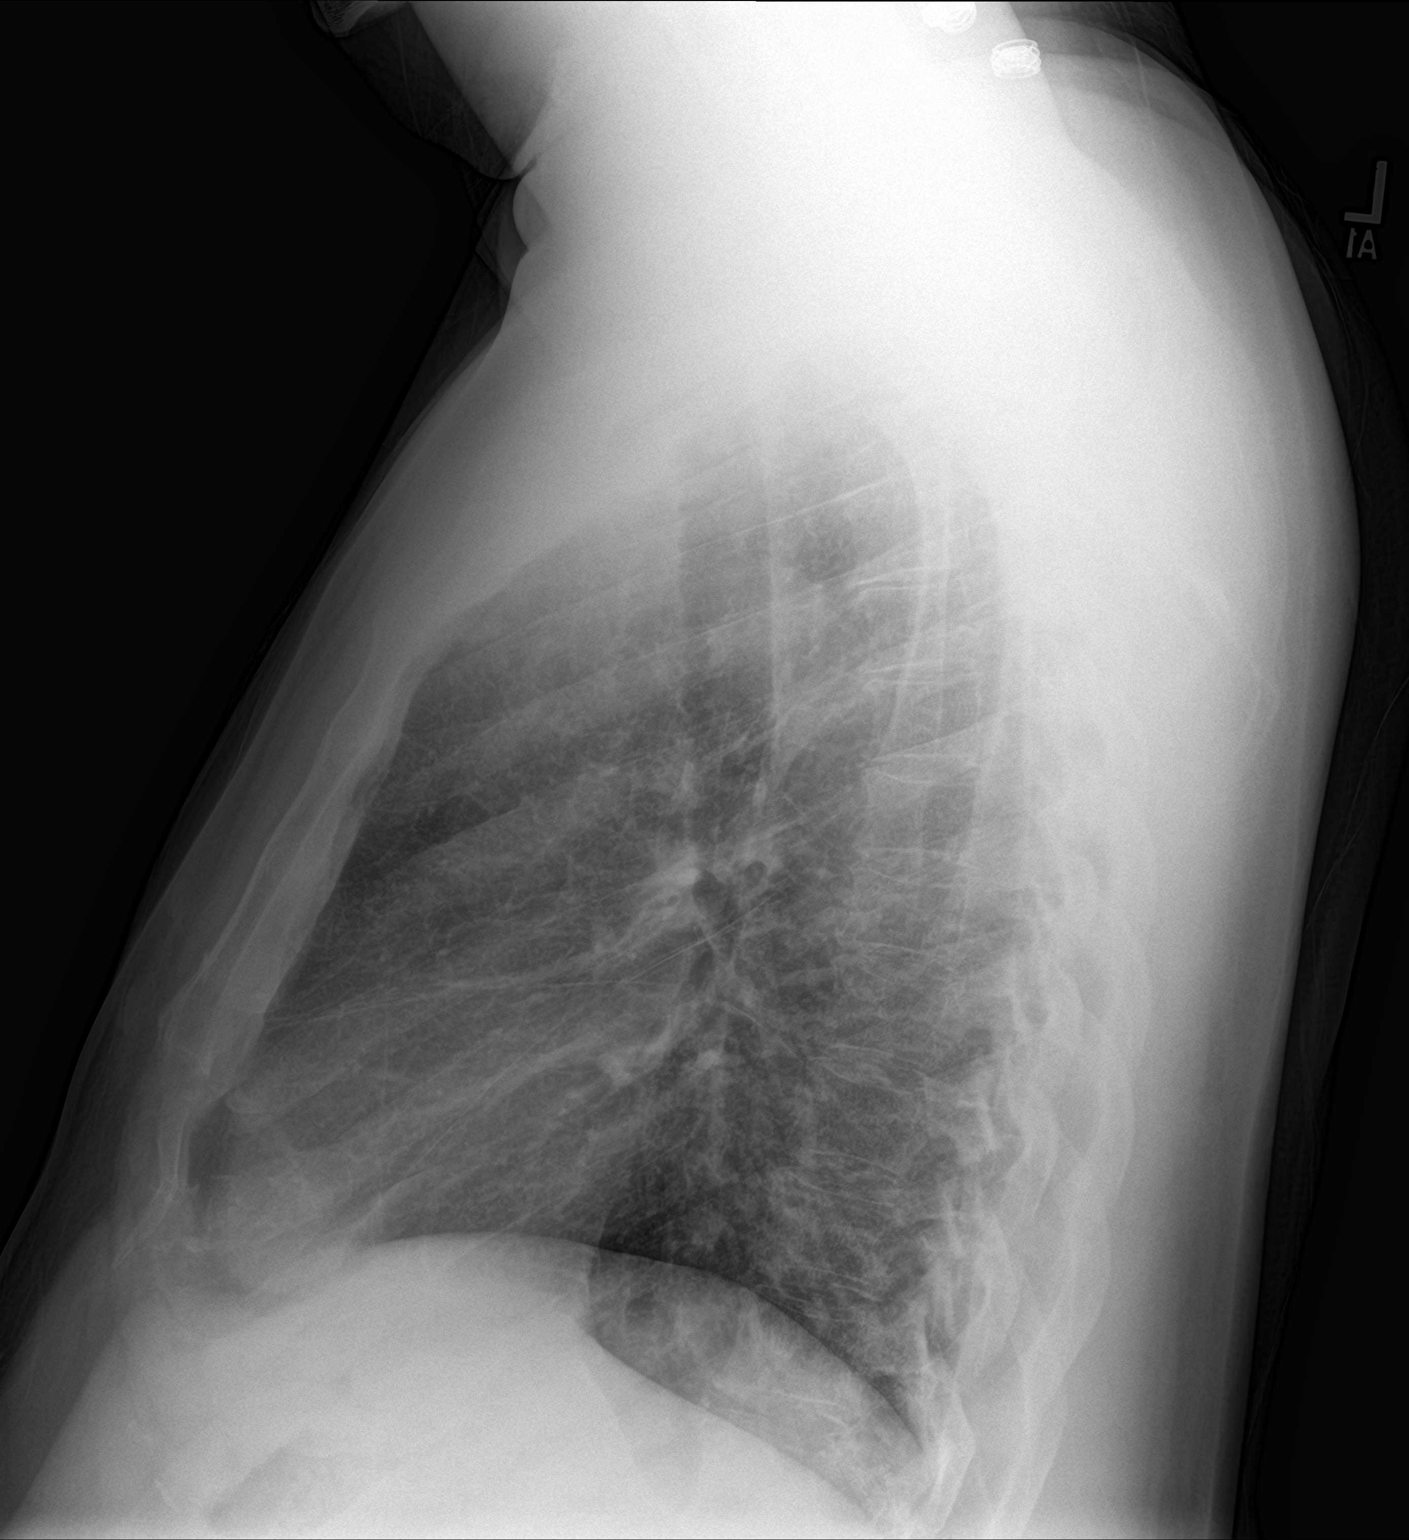

[2 of 2 positions shown; findings below may reference images not displayed]

FINDINGS: The heart size and mediastinal contours are within normal limits.
Both lungs are clear. No pneumothorax or pleural effusion is noted.
The visualized skeletal structures are unremarkable.
IMPRESSION: No active cardiopulmonary disease.

## 2018-06-02 ENCOUNTER — Encounter: Payer: Self-pay | Admitting: Family Medicine

## 2018-06-28 ENCOUNTER — Other Ambulatory Visit: Payer: Self-pay | Admitting: Family Medicine

## 2018-07-02 ENCOUNTER — Ambulatory Visit (INDEPENDENT_AMBULATORY_CARE_PROVIDER_SITE_OTHER): Payer: BC Managed Care – PPO | Admitting: Family Medicine

## 2018-07-02 ENCOUNTER — Other Ambulatory Visit: Payer: Self-pay

## 2018-07-02 ENCOUNTER — Encounter: Payer: Self-pay | Admitting: Family Medicine

## 2018-07-02 VITALS — BP 130/92 | HR 82 | Ht 73.5 in | Wt 263.6 lb

## 2018-07-02 DIAGNOSIS — Z1211 Encounter for screening for malignant neoplasm of colon: Secondary | ICD-10-CM | POA: Diagnosis not present

## 2018-07-02 DIAGNOSIS — E7800 Pure hypercholesterolemia, unspecified: Secondary | ICD-10-CM

## 2018-07-02 DIAGNOSIS — Z79899 Other long term (current) drug therapy: Secondary | ICD-10-CM

## 2018-07-02 DIAGNOSIS — Z125 Encounter for screening for malignant neoplasm of prostate: Secondary | ICD-10-CM | POA: Diagnosis not present

## 2018-07-02 DIAGNOSIS — Z0000 Encounter for general adult medical examination without abnormal findings: Secondary | ICD-10-CM | POA: Diagnosis not present

## 2018-07-02 DIAGNOSIS — E669 Obesity, unspecified: Secondary | ICD-10-CM | POA: Diagnosis not present

## 2018-07-02 DIAGNOSIS — E119 Type 2 diabetes mellitus without complications: Secondary | ICD-10-CM

## 2018-07-02 DIAGNOSIS — G4730 Sleep apnea, unspecified: Secondary | ICD-10-CM | POA: Diagnosis not present

## 2018-07-02 DIAGNOSIS — Z23 Encounter for immunization: Secondary | ICD-10-CM

## 2018-07-02 DIAGNOSIS — F17200 Nicotine dependence, unspecified, uncomplicated: Secondary | ICD-10-CM | POA: Diagnosis not present

## 2018-07-02 NOTE — Progress Notes (Signed)
Subjective:    Patient ID: Marco Campbell, male    DOB: Oct 02, 1965, 53 y.o.   MRN: [id number]  HPI Chief Complaint  Patient presents with  . fasting cpe    fasting cpe, not checking sugars, taking DM meds at prescribed. eyes checked back in july    He is here for a complete physical exam and to follow up on chronic health conditions. He is not due for Hgb A1c until July 08 2018. He is aware that he is a few days early for this.   He verbalizes that he does not have a lot of time today and wants to know how long the physical is going to take.  Other providers: Cardiologist- Dr. Percival Spanish  Dermatologist [facility]  OSA and not using CPAP.  His cardiologist recommended that he call Dr. Claiborne Billings and schedule a sleep study.  He has not yet done this. He has not scheduled a CT calcium as recommended by his cardiologist.  DM- Metformin once daily. He is not checking his blood sugars at home. Denies any concerns.  Eye exam done.  HTN- states he did not take his medication this morning. Reports good daily compliance. Does not check BP at home. No concerns with medication.  HL- reports good daily compliance with statin.   Ibuprofen and Tylenol as needed for headaches.   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 Works at [employer] also.  Smokes 10 cigarettes per day for the past 30 years. States he quit one time but is not ready to try and stop right now.   Drinks alcohol occasionally. Denies drug use  Diet: reports a healthy diet Exercise: nothing but is active at work.   Immunizations: needs pneumonia due to diabetes   Health maintenance:  Colonoscopy: never. He refused to do this at his previous appointment.  Last PSA: never  Last Eye Exam: up to date   Wears seatbelt always, smoke detectors in home and functioning, does not text while driving, feels safe in home environment.  Reviewed  allergies, medications, past medical, surgical, family, and social history.   Review of Systems Review of Systems Constitutional: -fever, -chills, -sweats, -unexpected weight change,-fatigue ENT: -runny nose, -ear pain, -sore throat Cardiology:  -chest pain, -palpitations, -edema Respiratory: -cough, -shortness of breath, -wheezing Gastroenterology: -abdominal pain, -nausea, -vomiting, -diarrhea, -constipation  Hematology: -bleeding or bruising problems Musculoskeletal: -arthralgias, -myalgias, -joint swelling, -back pain Ophthalmology: -vision changes Urology: -dysuria, -difficulty urinating, -hematuria, -urinary frequency, -urgency Neurology: -headache, -weakness, -tingling, -numbness       Objective:   Physical Exam BP (!) 130/92   Pulse 82   Ht 6' 1.5" (1.867 m)   Wt 263 lb 9.6 oz (119.6 kg)   BMI 34.31 kg/m   General Appearance:    Alert, cooperative, no distress, appears stated age  Head:    Normocephalic, without obvious abnormality, atraumatic  Eyes:    PERRL, conjunctiva/corneas clear, EOM's intact, fundi    benign  Ears:    Normal TM's and external ear canals  Nose:   Nares normal, mucosa normal, no drainage or sinus   tenderness  Throat:   Lips, mucosa, and tongue normal; teeth and gums normal  Neck:   Supple, no lymphadenopathy;  thyroid:  no   enlargement/tenderness/nodules; no carotid   bruit or JVD  Back:    Spine nontender, no curvature, ROM normal, no CVA  tenderness  Lungs:     Clear to auscultation bilaterally without wheezes, rales or     ronchi; respirations unlabored  Chest Wall:    No tenderness or deformity   Heart:    Regular rate and rhythm, S1 and S2 normal, no murmur, rub   or gallop  Breast Exam:    No chest wall tenderness, masses or gynecomastia  Abdomen:     Soft, non-tender, nondistended, normoactive bowel sounds,    no masses, no hepatosplenomegaly  Genitalia:    Declines      Extremities:   No clubbing, cyanosis or edema  Pulses:    2+ and symmetric all extremities  Skin:   Skin color, texture, turgor normal, no rashes or lesions  Lymph nodes:   Cervical, supraclavicular, and axillary nodes normal  Neurologic:   CNII-XII intact, normal strength, sensation and gait; reflexes 2+ and symmetric throughout          Psych:   Normal mood, affect, hygiene and grooming.     Urinalysis dipstick: did not obtain specimen for UA      Assessment & Plan:  Routine general medical examination at a health care facility - Plan: CBC with Differential/Platelet, Basic metabolic panel, POCT Urinalysis DIP (Proadvantage Device)  Sleep apnea, unspecified type  Elevated LDL cholesterol level - Plan: Lipid panel  Obesity (BMI 30-39.9)  Smoker  Controlled type 2 diabetes mellitus without complication, without long-term current use of insulin ([place]) - Plan: Microalbumin / creatinine urine ratio  Screening for prostate cancer - Plan: PSA  Medication management - Plan: Lipid panel  Need for vaccination against Streptococcus pneumoniae - Plan: Pneumococcal conjugate vaccine 13-valent  Screen for colon cancer - Plan: Ambulatory referral to Gastroenterology  He is a 53 year old male who is here today for a fasting CPE and follow-up on chronic health conditions including hypertension, diabetes, hyperlipidemia. He will need to return on March 17 for nurse visit to have a hemoglobin A1c repeated.  It has not been 3 months yet.  Discussed that I could not guarantee his insurance would cover discharge. Continue on once daily metformin for now.  Recommend that he start checking his blood sugars a couple of times per week.  Counseling on healthy diet and exercise. Discussed that his blood pressure is not in goal range today.  He does not check his blood pressure at home.  Reports good daily compliance with medication.  He states it is elevated because he did not take his medication this morning.  Advised him to check his blood pressure and if it is  not in goal range then he needs to follow-up with me.  Discussed low-sodium diet. Recommend that he follow-up with his cardiologist as advised including having the CT calcium score. OSA and is not currently using CPAP.  His cardiologist recommended that he have a sleep study with Dr. Claiborne Billings.  I will give him Dr. Evette Georges information today so that he can contact him. He would like to have a PSA checked.  This was ordered. He is agreeable to have a screening colonoscopy.  Referral was made. Prevnar 13 given.  Recommend he check on the Shingrix vaccine and if this is affordable then he can let us know. Smoking cessation counseling done. Follow-up pending labs and when he returns March 17 for hemoglobin A1c.

## 2018-07-02 NOTE — Patient Instructions (Addendum)
Please return for a lab visit on March 17 to have a hemoglobin A1c fingerstick for your diabetes. [facility] will call you to schedule an office visit to discuss colonoscopy.  Keep an eye on your blood pressure and if you are not seeing goal readings less than 130/80 then let me know.  Continue on your current medications. I do recommend that you stop smoking!  Dr. Claiborne Billings cardiology at [facility] [phone]   Getting at least 150 minutes of physical activity per week outside of your usual daily activity.  You can call your insurance carrier and check on the shingles vaccine.  If it is affordable and you would like to get it then you can come in for a nurse visit.  Please call to schedule this.  We will call you with your lab results.    Preventive Care 40-64 Years, Male Preventive care refers to lifestyle choices and visits with your health care provider that can promote health and wellness. What does preventive care include?   A yearly physical exam. This is also called an annual well check.  Dental exams once or twice a year.  Routine eye exams. Ask your health care provider how often you should have your eyes checked.  Personal lifestyle choices, including: ? Daily care of your teeth and gums. ? Regular physical activity. ? Eating a healthy diet. ? Avoiding tobacco and drug use. ? Limiting alcohol use. ? Practicing safe sex. ? Taking low-dose aspirin every day starting at age 57. What happens during an annual well check? The services and screenings done by your health care provider during your annual well check will depend on your age, overall health, lifestyle risk factors, and family history of disease. Counseling Your health care provider may ask you questions about your:  Alcohol use.  Tobacco use.  Drug use.  Emotional well-being.  Home and relationship well-being.  Sexual activity.  Eating habits.  Work and work Statistician. Screening  You may have the following tests or measurements:  Height, weight, and BMI.  Blood pressure.  Lipid and cholesterol levels. These may be checked every 5 years, or more frequently if you are over 40 years old.  Skin check.  Lung cancer screening. You may have this screening every year starting at age 41 if you have a 30-pack-year history of smoking and currently smoke or have quit within the past 15 years.  Colorectal cancer screening. All adults should have this screening starting at age 33 and continuing until age 62. Your health care provider may recommend screening at age 71. You will have tests every 1-10 years, depending on your results and the type of screening test. People at increased risk should start screening at an earlier age. Screening tests may include: ? Guaiac-based fecal occult blood testing. ? Fecal immunochemical test (FIT). ? Stool DNA test. ? Virtual colonoscopy. ? Sigmoidoscopy. During this test, a flexible tube with a tiny camera (sigmoidoscope) is used to examine your rectum and lower colon. The sigmoidoscope is inserted through your anus into your rectum and lower colon. ? Colonoscopy. During this test, a long, thin, flexible tube with a tiny camera (colonoscope) is used to examine your entire colon and rectum.  Prostate cancer screening. Recommendations will vary depending on your family history and other risks.  Hepatitis C blood test.  Hepatitis B blood test.  Sexually transmitted disease (STD) testing.  Diabetes screening. This is done by checking your blood sugar (glucose) after you have not eaten  for a while (fasting). You may have this done every 1-3 years. Discuss your test results, treatment options, and if necessary, the need for more tests with your health care provider. Vaccines Your health care provider may recommend certain vaccines, such as:  Influenza vaccine. This is recommended every year.  Tetanus, diphtheria, and acellular pertussis  (Tdap, Td) vaccine. You may need a Td booster every 10 years.  Varicella vaccine. You may need this if you have not been vaccinated.  Zoster vaccine. You may need this after age 42.  Measles, mumps, and rubella (MMR) vaccine. You may need at least one dose of MMR if you were born in 1957 or later. You may also need a second dose.  Pneumococcal 13-valent conjugate (PCV13) vaccine. You may need this if you have certain conditions and have not been vaccinated.  Pneumococcal polysaccharide (PPSV23) vaccine. You may need one or two doses if you smoke cigarettes or if you have certain conditions.  Meningococcal vaccine. You may need this if you have certain conditions.  Hepatitis A vaccine. You may need this if you have certain conditions or if you travel or work in places where you may be exposed to hepatitis A.  Hepatitis B vaccine. You may need this if you have certain conditions or if you travel or work in places where you may be exposed to hepatitis B.  Haemophilus influenzae type b (Hib) vaccine. You may need this if you have certain risk factors. Talk to your health care provider about which screenings and vaccines you need and how often you need them. This information is not intended to replace advice given to you by your health care provider. Make sure you discuss any questions you have with your health care provider. Document Released: 05/06/2015 Document Revised: 05/30/2017 Document Reviewed: 02/08/2015 Elsevier Interactive Patient Education  2019 [employer].

## 2018-07-03 LAB — BASIC METABOLIC PANEL
BUN/Creatinine Ratio: 14 (ref 9–20)
BUN: 18 mg/dL (ref 6–24)
CO2: 21 mmol/L (ref 20–29)
Calcium: 9.7 mg/dL (ref 8.7–10.2)
Chloride: 104 mmol/L (ref 96–106)
Creatinine, Ser: 1.28 mg/dL — ABNORMAL HIGH (ref 0.76–1.27)
GFR calc Af Amer: 74 mL/min/{1.73_m2} (ref >59)
GFR calc non Af Amer: 64 mL/min/{1.73_m2} (ref >59)
GLUCOSE: 120 mg/dL — AB (ref 65–99)
POTASSIUM: 4.3 mmol/L (ref 3.5–5.2)
Sodium: 141 mmol/L (ref 134–144)

## 2018-07-03 LAB — CBC WITH DIFFERENTIAL/PLATELET
Basophils Absolute: 0 10*3/uL (ref 0.0–0.2)
Basos: 0 %
EOS (ABSOLUTE): 0.2 10*3/uL (ref 0.0–0.4)
Eos: 2 %
Hematocrit: 44 % (ref 37.5–51.0)
Hemoglobin: 15.1 g/dL (ref 13.0–17.7)
Immature Grans (Abs): 0 10*3/uL (ref 0.0–0.1)
Immature Granulocytes: 0 %
Lymphocytes Absolute: 2.4 10*3/uL (ref 0.7–3.1)
Lymphs: 26 %
MCH: 28.2 pg (ref 26.6–33.0)
MCHC: 34.3 g/dL (ref 31.5–35.7)
MCV: 82 fL (ref 79–97)
MONOS ABS: 1 10*3/uL — AB (ref 0.1–0.9)
Monocytes: 10 %
Neutrophils Absolute: 5.6 10*3/uL (ref 1.4–7.0)
Neutrophils: 62 %
PLATELETS: 266 10*3/uL (ref 150–450)
RBC: 5.35 x10E6/uL (ref 4.14–5.80)
RDW: 12.6 % (ref 11.6–15.4)
WBC: 9.2 10*3/uL (ref 3.4–10.8)

## 2018-07-03 LAB — MICROALBUMIN / CREATININE URINE RATIO
Creatinine, Urine: 96.7 mg/dL
Microalb/Creat Ratio: 6 mg/g creat (ref 0–29)
Microalbumin, Urine: 6.2 ug/mL

## 2018-07-03 LAB — PSA: Prostate Specific Ag, Serum: 1 ng/mL (ref 0.0–4.0)

## 2018-07-03 LAB — LIPID PANEL
Chol/HDL Ratio: 3.8 ratio (ref 0.0–5.0)
Cholesterol, Total: 185 mg/dL (ref 100–199)
HDL: 49 mg/dL (ref >39)
LDL Calculated: 117 mg/dL — ABNORMAL HIGH (ref 0–99)
Triglycerides: 94 mg/dL (ref 0–149)
VLDL Cholesterol Cal: 19 mg/dL (ref 5–40)

## 2018-07-08 ENCOUNTER — Other Ambulatory Visit: Payer: BC Managed Care – PPO

## 2018-07-08 ENCOUNTER — Other Ambulatory Visit: Payer: Self-pay

## 2018-07-08 DIAGNOSIS — E119 Type 2 diabetes mellitus without complications: Secondary | ICD-10-CM

## 2018-07-08 LAB — POCT GLYCOSYLATED HEMOGLOBIN (HGB A1C): Hemoglobin A1C: 6.6 % — AB (ref 4.0–5.6)

## 2018-07-28 ENCOUNTER — Telehealth: Payer: Self-pay | Admitting: Family Medicine

## 2018-07-28 MED ORDER — EPINEPHRINE 0.1 MG/0.1ML IJ SOAJ: 1.0000 "pen " | INTRAMUSCULAR | 0 refills | Status: DC | PRN

## 2018-07-28 NOTE — Telephone Encounter (Signed)
Pt needs epi pen refill to [facility]

## 2018-07-28 NOTE — Telephone Encounter (Signed)
Done

## 2018-07-28 NOTE — Telephone Encounter (Signed)
Please send in epi pen refill for him. Thanks.

## 2018-07-29 MED ORDER — EPINEPHRINE 0.3 MG/0.3ML IJ SOAJ: 0.3000 mg | INTRAMUSCULAR | 0 refills | Status: DC | PRN

## 2018-07-29 NOTE — Telephone Encounter (Signed)
Per fax 0.1 not covered by pharmacy, ok to switch per Audelia Acton to covered .3mg 

## 2018-10-03 ENCOUNTER — Other Ambulatory Visit: Payer: Self-pay | Admitting: Family Medicine

## 2018-10-07 ENCOUNTER — Telehealth: Payer: Self-pay | Admitting: *Deleted

## 2018-10-07 NOTE — Telephone Encounter (Signed)
A message was left, re: follow up visit. 

## 2018-10-20 NOTE — Telephone Encounter (Signed)
A message was left, re: follow up visit. 

## 2018-10-29 ENCOUNTER — Encounter: Payer: Self-pay | Admitting: Gastroenterology

## 2018-11-07 ENCOUNTER — Telehealth: Payer: Self-pay | Admitting: Family Medicine

## 2018-11-07 DIAGNOSIS — Z8669 Personal history of other diseases of the nervous system and sense organs: Secondary | ICD-10-CM

## 2018-11-07 NOTE — Telephone Encounter (Signed)
Ok to order a sleep study due to history of sleep apnea. Has he been using a CPAP and if not, why hasn't he?

## 2018-11-07 NOTE — Telephone Encounter (Signed)
I have put order in for sleep study. It has been 8-10 years since he has been using cpap machine. He tried to get his licenses and needs sleep study done

## 2018-11-07 NOTE — Telephone Encounter (Signed)
   Pt called he needs new sleep study done He has not had one done in a while but does have sleep apnea and went he went for [employer] they told him that he needs to get one done

## 2018-11-11 ENCOUNTER — Telehealth: Payer: Self-pay

## 2018-11-11 NOTE — Telephone Encounter (Signed)
    COVID-19 Pre-Screening Questions:  . In the past 7 to 10 days have you had a cough,  shortness of breath, headache, congestion, fever (100 or greater) body aches, chills, sore throat, or sudden loss of taste or sense of smell? NO . Have you been around anyone with known Covid 19. NO . Have you been around anyone who is awaiting Covid 19 test results in the past 7 to 10 days? NO . Have you been around anyone who has been exposed to Covid 19, or has mentioned symptoms of Covid 19 within the past 7 to 10 days? NO  PT WILL ARRIVE EARLY W/MAKS AND NO VISITORS

## 2018-11-11 NOTE — Progress Notes (Signed)
Cardiology Office Note   Date:  11/12/2018   ID:  Marco Campbell, DOB July 20, 1965, MRN [id number]  PCP:  Girtha Rm, NP-C  Cardiologist:   Minus Breeding, MD Referring:  Girtha Rm, NP-C  No chief complaint on file.     History of Present Illness: Marco Campbell is a 53 y.o. male who was referred by Girtha Rm, NP-C for evaluation of a strong family history of cardiomyopathy and coronary artery disease.  The patient is never had any cardiac issues himself.  I did see that he had a stress test some years ago with a slightly reduced ejection fraction of 53% but no evidence of ischemia.    WhenI last saw him last year he was to have an echo but he did not complete this.  He says that he really did not have the funds to do this.  As we explore this further his family history of cardiomyopathy is somewhat vague.  Both of his siblings were said to have cardiomyopathy and never had a heart failure exacerbation or ICD or hospitalization and he said they are both doing fine and working.  He does not have any shortness of breath, PND or orthopnea.  He has no weight gain or edema.  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  He does have a strong family history of obstructive coronary disease but he himself does not have chest pressure, neck or arm discomfort.  He is not having any palpitations, presyncope or syncope.  He continues to smoke a few cigarettes daily.   Past Medical History:  Diagnosis Date  . AKI (acute kidney injury) ([place]) 07/09/2016  . Blurry vision 07/09/2016  . Controlled type 2 diabetes mellitus without complication, without long-term current use of insulin ([place]) 04/09/2017  . Diabetes mellitus without complication ([place])   . Hypertension   . Sleep apnea    Does not use CPAP  . SOB (shortness of breath) 11/07/2017    No past surgical history on file.   Current Outpatient Medications  Medication Sig Dispense Refill   . atorvastatin (LIPITOR) 20 MG tablet Take 1 tablet (20 mg total) by mouth daily. 90 tablet 1  . BAYER MICROLET LANCETS lancets by Other route. Pt was given this in office 09/05/16 pfm    . blood glucose meter kit and supplies Dispense based on patient and insurance preference. Use up to four times daily as directed. (FOR ICD-9 250.00, 250.01). 1 each 0  . EPINEPHrine 0.3 mg/0.3 mL IJ SOAJ injection Inject 0.3 mLs (0.3 mg total) into the muscle as needed for anaphylaxis. 1 Device 0  . ibuprofen (ADVIL,MOTRIN) 600 MG tablet Take 1 tablet (600 mg total) by mouth every 6 (six) hours as needed. 30 tablet 0  . lisinopril-hydrochlorothiazide (PRINZIDE,ZESTORETIC) 20-12.5 MG tablet Take 1 tablet by mouth daily. 90 tablet 1  . metFORMIN (GLUCOPHAGE-XR) 750 MG 24 hr tablet TAKE 1 TABLET BY MOUTH EVERY DAY WITH BREAKFAST 90 tablet 0  . ONE TOUCH ULTRA TEST test strip     . varenicline (CHANTIX CONTINUING MONTH PAK) 1 MG tablet Take 1 tablet (1 mg total) by mouth 2 (two) times daily. 60 tablet 11  . varenicline (CHANTIX STARTING MONTH PAK) 0.5 MG X 11 & 1 MG X 42 tablet Take one 0.5 mg tablet by mouth once daily for 3 days, then increase to one 0.5 mg tablet twice daily for 4  days, then increase to one 1 mg tablet twice daily. 53 tablet 0   No current facility-administered medications for this visit.     Allergies:   Bee venom    ROS:  Please see the history of present illness.   Otherwise, review of systems are positive for none.   All other systems are reviewed and negative.    PHYSICAL EXAM: VS:  BP 117/78   Pulse 83   Temp (!) 97.3 F (36.3 C) (Temporal)   Ht _0  (1.854 m)   Wt 250 lb 3.2 oz (113.5 kg)   SpO2 97%   BMI 33.01 kg/m  , BMI Body mass index is 33.01 kg/m. GENERAL:  Well appearing NECK:  No jugular venous distention, waveform within normal limits, carotid upstroke brisk and symmetric, no bruits, no thyromegaly LUNGS:  Clear to auscultation bilaterally CHEST:  Unremarkable  HEART:  PMI not displaced or sustained,S1 and S2 within normal limits, no S3, no S4, no clicks, no rubs, no murmurs ABD:  Flat, positive bowel sounds normal in frequency in pitch, no bruits, no rebound, no guarding, no midline pulsatile mass, no hepatomegaly, no splenomegaly EXT:  2 plus pulses throughout, no edema, no cyanosis no clubbing   EKG:  EKG is  ordered today. The ekg ordered today demonstrates sinus rhythm, rate 83, axis within normal limits, intervals within normal limits, low voltage in the limb leads   Recent Labs: 04/07/2018: ALT 13 07/02/2018: BUN 18; Creatinine, Ser 1.28; Hemoglobin 15.1; Platelets 266; Potassium 4.3; Sodium 141    Lipid Panel    Component Value Date/Time   CHOL 185 07/02/2018 0915   TRIG 94 07/02/2018 0915   HDL 49 07/02/2018 0915   CHOLHDL 3.8 07/02/2018 0915   CHOLHDL 3.7 03/05/2017 1050   VLDL 25 09/05/2016 1100   LDLCALC 117 (H) 07/02/2018 0915   LDLCALC 133 (H) 03/05/2017 1050      Wt Readings from Last 3 Encounters:  11/12/18 250 lb 3.2 oz (113.5 kg)  07/02/18 263 lb 9.6 oz (119.6 kg)  04/07/18 257 lb 6.4 oz (116.8 kg)      Other studies Reviewed: Additional studies/ records that were reviewed today include: Labs Review of the above records demonstrates:     ASSESSMENT AND PLAN:  FAMILY HISTORY OF CM:   It is a vague family history.  He has no symptoms.  At this point I do not feel strongly that he needs to be screened for cardiomyopathy as he has an absolutely normal physical exam.  He has no symptoms.  I will follow this clinically.    FAMILY HISTORY OF CAD: He has a strong family history of early coronary artery disease.  He needs coronary calcium screening.  SLEEP APNEA:    We have arranged for this and we will see when this is scheduled.   TOBACCO USE: I am going to give him a prescription for Chantix.  He agrees to try this.  It worked for him in the past but he picked up smoking cigarettes again.   Current medicines are  reviewed at length with the patient today.  The patient does not have concerns regarding medicines.  The following changes have been made:  no change  Labs/ tests ordered today include:    Orders Placed This Encounter  Procedures  . EKG 12-Lead     Disposition:   FU with me based on the results of the above.     Signed, Minus Breeding, MD  11/12/2018 5:23  PM    [facility]

## 2018-11-11 NOTE — Telephone Encounter (Signed)
7-22 @320pm  [facility] [id number] QUESTIONS

## 2018-11-12 ENCOUNTER — Other Ambulatory Visit: Payer: Self-pay

## 2018-11-12 ENCOUNTER — Encounter: Payer: Self-pay | Admitting: Cardiology

## 2018-11-12 ENCOUNTER — Ambulatory Visit (INDEPENDENT_AMBULATORY_CARE_PROVIDER_SITE_OTHER): Payer: BC Managed Care – PPO | Admitting: Cardiology

## 2018-11-12 VITALS — BP 117/78 | HR 83 | Temp 97.3°F | Ht 73.0 in | Wt 250.2 lb

## 2018-11-12 DIAGNOSIS — E1159 Type 2 diabetes mellitus with other circulatory complications: Secondary | ICD-10-CM

## 2018-11-12 DIAGNOSIS — I10 Essential (primary) hypertension: Secondary | ICD-10-CM

## 2018-11-12 DIAGNOSIS — I152 Hypertension secondary to endocrine disorders: Secondary | ICD-10-CM

## 2018-11-12 MED ORDER — VARENICLINE TARTRATE 1 MG PO TABS: 1.0000 mg | ORAL_TABLET | Freq: Two times a day (BID) | ORAL | 11 refills | Status: DC

## 2018-11-12 MED ORDER — CHANTIX STARTING MONTH PAK 0.5 MG X 11 & 1 MG X 42 PO TABS: ORAL_TABLET | ORAL | 0 refills | Status: DC

## 2018-11-12 NOTE — Patient Instructions (Signed)
Medication Instructions:  START CHANTIX-MAKE SURE TO USE COPAY CARD If you need a refill on your cardiac medications before your next appointment, please call your pharmacy.  Testing/Procedures: PLEASE SCHEDULE CORONARY CA SCORE-ALREADY ORDERED  Special Instructions: Your physician has recommended that you have a sleep study. This test records several body functions during sleep, including: brain activity, eye movement, oxygen and carbon dioxide blood levels, heart rate and rhythm, breathing rate and rhythm, the flow of air through your mouth and nose, snoring, body muscle movements, and chest and belly movement. SOMEONE WILL BE CALLING YOU TO DISCUSS/SCHEDULE  Follow-Up: You will need a follow up appointment AS NEED, PLEASE CALL IF YOU NEED ANYTHING. You may see Minus Breeding, MD or one of the following Advanced Practice Providers on your designated Care Team: Rosaria Ferries, PA-C   Jory Sims, DNP, ANP     At [facility], you and your health needs are our priority.  As part of our continuing mission to provide you with exceptional heart care, we have created designated Provider Care Teams.  These Care Teams include your primary Cardiologist (physician) and Advanced Practice Providers (APPs -  Physician Assistants and Nurse Practitioners) who all work together to provide you with the care you need, when you need it.  Thank you for choosing [facility] at [facility]!!

## 2018-11-14 ENCOUNTER — Other Ambulatory Visit: Payer: Self-pay | Admitting: Family Medicine

## 2018-11-14 DIAGNOSIS — I10 Essential (primary) hypertension: Secondary | ICD-10-CM

## 2018-11-18 ENCOUNTER — Telehealth: Payer: Self-pay | Admitting: *Deleted

## 2018-11-18 NOTE — Telephone Encounter (Signed)
PA submitted for sleep study to [facility] via phone. Clinicals faxed by Lucilla Lame to [phone]. Order # [id number].

## 2018-11-18 NOTE — Telephone Encounter (Signed)
-----   Message from Roland Earl sent at 11/12/2018  4:18 PM EDT ----- Regarding: Sleep Study

## 2018-11-19 ENCOUNTER — Telehealth: Payer: Self-pay | Admitting: Cardiology

## 2018-11-19 NOTE — Telephone Encounter (Signed)
New Message:    Please call Vaughan Basta, she says it is  Concerning an In Home Sleep Study.

## 2018-11-19 NOTE — Telephone Encounter (Signed)
Returned a call to patient. Left message PA has been sent to [facility]. Once approval hs been received, the home sleep study will be scheduled. Advised to call back if further questions and or concerns.

## 2018-11-21 ENCOUNTER — Telehealth: Payer: Self-pay | Admitting: Cardiology

## 2018-11-21 NOTE — Telephone Encounter (Signed)
Follow up:    Vaughan Basta calling from [facility] needs clinic by 11/26/18 fax [phone]

## 2018-11-25 NOTE — Telephone Encounter (Signed)
Refaxed the office note to [facility] a second time. This time to this fax number provided by [facility] [phone].

## 2018-11-27 ENCOUNTER — Ambulatory Visit (AMBULATORY_SURGERY_CENTER): Payer: Self-pay

## 2018-11-27 ENCOUNTER — Other Ambulatory Visit: Payer: Self-pay

## 2018-11-27 VITALS — Ht 73.0 in | Wt 250.0 lb

## 2018-11-27 DIAGNOSIS — Z1211 Encounter for screening for malignant neoplasm of colon: Secondary | ICD-10-CM

## 2018-11-27 MED ORDER — NA SULFATE-K SULFATE-MG SULF 17.5-3.13-1.6 GM/177ML PO SOLN: 1.0000 | Freq: Once | ORAL | 0 refills | Status: AC

## 2018-11-27 NOTE — Progress Notes (Signed)
Denies allergies to eggs or soy products. Denies complication of anesthesia or sedation. Denies use of weight loss medication. Denies use of O2.   Emmi instructions given for colonoscopy.   Pre-Visit was conducted by phone due to Covid 19. Instructions were reviewed and mailed to patients confirmed home address. A 15.00 coupon for Suprep was given to the patient. Patient was encouraged to call if he had any questions regarding instructions. Patient verbalized understanding of instructions.

## 2018-12-04 ENCOUNTER — Other Ambulatory Visit: Payer: Self-pay | Admitting: Cardiology

## 2018-12-04 ENCOUNTER — Telehealth: Payer: Self-pay | Admitting: Cardiology

## 2018-12-04 DIAGNOSIS — G4730 Sleep apnea, unspecified: Secondary | ICD-10-CM

## 2018-12-04 DIAGNOSIS — E1159 Type 2 diabetes mellitus with other circulatory complications: Secondary | ICD-10-CM

## 2018-12-04 NOTE — Telephone Encounter (Signed)
Left message to return a call for sleep study and COVID appointment details.

## 2018-12-04 NOTE — Telephone Encounter (Signed)
Patient returned call and was informed of sleep study and [facility] appointments. He was also given quarantine instructions. He voiced verbal understanding of instructions.

## 2018-12-05 ENCOUNTER — Encounter (INDEPENDENT_AMBULATORY_CARE_PROVIDER_SITE_OTHER): Payer: Self-pay

## 2018-12-05 ENCOUNTER — Other Ambulatory Visit: Payer: Self-pay

## 2018-12-05 ENCOUNTER — Ambulatory Visit (INDEPENDENT_AMBULATORY_CARE_PROVIDER_SITE_OTHER)
Admission: RE | Admit: 2018-12-05 | Discharge: 2018-12-05 | Disposition: A | Discharge status: Home / Self Care | Payer: Self-pay | Source: Ambulatory Visit | Attending: Cardiology | Admitting: Cardiology

## 2018-12-05 DIAGNOSIS — Z8249 Family history of ischemic heart disease and other diseases of the circulatory system: Secondary | ICD-10-CM

## 2018-12-05 DIAGNOSIS — R0602 Shortness of breath: Secondary | ICD-10-CM

## 2018-12-09 ENCOUNTER — Telehealth: Payer: Self-pay | Admitting: Gastroenterology

## 2018-12-09 NOTE — Telephone Encounter (Signed)
Pt is scheduled 12/11/18 colon and would like to know when he can take his cholesterol and BP meds.

## 2018-12-09 NOTE — Telephone Encounter (Signed)
Pt instructed meds by 12 noon Thursday - nothing by mouth after  12  Noon 8-20 Thursday

## 2018-12-10 ENCOUNTER — Telehealth: Payer: Self-pay | Admitting: Gastroenterology

## 2018-12-10 ENCOUNTER — Telehealth: Payer: Self-pay | Admitting: *Deleted

## 2018-12-10 DIAGNOSIS — Z8249 Family history of ischemic heart disease and other diseases of the circulatory system: Secondary | ICD-10-CM

## 2018-12-10 DIAGNOSIS — I152 Hypertension secondary to endocrine disorders: Secondary | ICD-10-CM

## 2018-12-10 DIAGNOSIS — E1159 Type 2 diabetes mellitus with other circulatory complications: Secondary | ICD-10-CM

## 2018-12-10 NOTE — Telephone Encounter (Signed)
°

## 2018-12-10 NOTE — Telephone Encounter (Signed)
-----   Message from Minus Breeding, MD sent at 12/09/2018 12:15 PM EDT ----- His coronary calcium score is low but elevated for his age. I would like to schedule him for a POET (Plain Old Exercise Treadmill).  Call Mr. Stmartin with the results and send results to Girtha Rm, NP-C

## 2018-12-10 NOTE — Telephone Encounter (Signed)
Advised patient, verbalized understanding Order placed and message sent to scheduling  

## 2018-12-10 NOTE — Telephone Encounter (Signed)
Returned call and answered "NO" to all screening questions. °

## 2018-12-11 ENCOUNTER — Ambulatory Visit (AMBULATORY_SURGERY_CENTER): Payer: BC Managed Care – PPO | Admitting: Gastroenterology

## 2018-12-11 ENCOUNTER — Other Ambulatory Visit: Payer: Self-pay

## 2018-12-11 ENCOUNTER — Encounter: Payer: Self-pay | Admitting: Gastroenterology

## 2018-12-11 VITALS — BP 122/70 | HR 65 | Temp 98.4°F | Resp 18 | Ht 73.0 in | Wt 250.0 lb

## 2018-12-11 DIAGNOSIS — D125 Benign neoplasm of sigmoid colon: Secondary | ICD-10-CM

## 2018-12-11 DIAGNOSIS — Z1211 Encounter for screening for malignant neoplasm of colon: Secondary | ICD-10-CM

## 2018-12-11 DIAGNOSIS — D120 Benign neoplasm of cecum: Secondary | ICD-10-CM

## 2018-12-11 MED ORDER — SODIUM CHLORIDE 0.9 % IV SOLN: 500.0000 mL | Freq: Once | INTRAVENOUS | Status: DC

## 2018-12-11 NOTE — Progress Notes (Signed)
To PACU, VSS. Report to Rn.tb 

## 2018-12-11 NOTE — Op Note (Signed)
[place] Patient Name: Marco Campbell Procedure Date: 12/11/2018 2:06 PM MRN: [id number] Endoscopist: Remo Lipps P. Havery Moros , MD Age: 53 Referring MD:  Date of Birth: [birth year] Gender: Male Account #: [id number] Procedure:                Colonoscopy Indications:              Screening for colorectal malignant neoplasm, This                            is the patient's first colonoscopy Medicines:                Monitored Anesthesia Care Procedure:                Pre-Anesthesia Assessment:                           - Prior to the procedure, a History and Physical                            was performed, and patient medications and                            allergies were reviewed. The patient's tolerance of                            previous anesthesia was also reviewed. The risks                            and benefits of the procedure and the sedation                            options and risks were discussed with the patient.                            All questions were answered, and informed consent                            was obtained. Prior Anticoagulants: The patient has                            taken no previous anticoagulant or antiplatelet                            agents. ASA Grade Assessment: III - A patient with                            severe systemic disease. After reviewing the risks                            and benefits, the patient was deemed in                            satisfactory condition to undergo the procedure.  After obtaining informed consent, the colonoscope                            was passed under direct vision. Throughout the                            procedure, the patient's blood pressure, pulse, and                            oxygen saturations were monitored continuously. The                            Colonoscope was introduced through the anus and                            advanced to the the  cecum, identified by                            appendiceal orifice and ileocecal valve. The                            colonoscopy was performed without difficulty. The                            patient tolerated the procedure well. The quality                            of the bowel preparation was adequate. The                            ileocecal valve, appendiceal orifice, and rectum                            were photographed. Scope In: 2:14:16 PM Scope Out: 2:30:15 PM Scope Withdrawal Time: 0 hours 13 minutes 19 seconds  Total Procedure Duration: 0 hours 15 minutes 59 seconds  Findings:                 The perianal and digital rectal examinations were                            normal.                           A diminutive polyp was found in the cecum. The                            polyp was sessile. The polyp was removed with a                            cold snare. Resection and retrieval were complete.                           A 3 mm polyp was found in the sigmoid colon. The  polyp was sessile. The polyp was removed with a                            cold snare. Resection and retrieval were complete.                           Multiple small-mouthed diverticula were found in                            the left colon.                           Internal hemorrhoids were found during                            retroflexion. The hemorrhoids were small.                           The exam was otherwise without abnormality. Complications:            No immediate complications. Estimated blood loss:                            Minimal. Estimated Blood Loss:     Estimated blood loss was minimal. Impression:               - One diminutive polyp in the cecum, removed with a                            cold snare. Resected and retrieved.                           - One 3 mm polyp in the sigmoid colon, removed with                            a cold snare.  Resected and retrieved.                           - Diverticulosis in the left colon.                           - Internal hemorrhoids.                           - The examination was otherwise normal. Recommendation:           - Patient has a contact number available for                            emergencies. The signs and symptoms of potential                            delayed complications were discussed with the                            patient. Return to normal activities tomorrow.  Written discharge instructions were provided to the                            patient.                           - Resume previous diet.                           - Continue present medications.                           - Await pathology results. Remo Lipps P. Havery Moros, MD 12/11/2018 2:33:24 PM This report has been signed electronically.

## 2018-12-11 NOTE — Patient Instructions (Signed)
Discharge instructions given. Handouts on polyps,diverticulosis and hemorrhoids. Resume previous medications. YOU HAD AN ENDOSCOPIC PROCEDURE TODAY AT THE [facility]:   Refer to the procedure report that was given to you for any specific questions about what was found during the examination.  If the procedure report does not answer your questions, please call your gastroenterologist to clarify.  If you requested that your care partner not be given the details of your procedure findings, then the procedure report has been included in a sealed envelope for you to review at your convenience later.  YOU SHOULD EXPECT: Some feelings of bloating in the abdomen. Passage of more gas than usual.  Walking can help get rid of the air that was put into your GI tract during the procedure and reduce the bloating. If you had a lower endoscopy (such as a colonoscopy or flexible sigmoidoscopy) you may notice spotting of blood in your stool or on the toilet paper. If you underwent a bowel prep for your procedure, you may not have a normal bowel movement for a few days.  Please Note:  You might notice some irritation and congestion in your nose or some drainage.  This is from the oxygen used during your procedure.  There is no need for concern and it should clear up in a day or so.  SYMPTOMS TO REPORT IMMEDIATELY:   Following lower endoscopy (colonoscopy or flexible sigmoidoscopy):  Excessive amounts of blood in the stool  Significant tenderness or worsening of abdominal pains  Swelling of the abdomen that is new, acute  Fever of 100F or higher  For urgent or emergent issues, a gastroenterologist can be reached at any hour by calling [phone].   DIET:  We do recommend a small meal at first, but then you may proceed to your regular diet.  Drink plenty of fluids but you should avoid alcoholic beverages for 24 hours.  ACTIVITY:  You should plan to take it easy for the rest of today and you  should NOT DRIVE or use heavy machinery until tomorrow (because of the sedation medicines used during the test).    FOLLOW UP: Our staff will call the number listed on your records 48-72 hours following your procedure to check on you and address any questions or concerns that you may have regarding the information given to you following your procedure. If we do not reach you, we will leave a message.  We will attempt to reach you two times.  During this call, we will ask if you have developed any symptoms of COVID 19. If you develop any symptoms (ie: fever, flu-like symptoms, shortness of breath, cough etc.) before then, please call [phone].  If you test positive for Covid 19 in the 2 weeks post procedure, please call and report this information to us.    If any biopsies were taken you will be contacted by phone or by letter within the next 1-3 weeks.  Please call us at [phone] if you have not heard about the biopsies in 3 weeks.    SIGNATURES/CONFIDENTIALITY: You and/or your care partner have signed paperwork which will be entered into your electronic medical record.  These signatures attest to the fact that that the information above on your After Visit Summary has been reviewed and is understood.  Full responsibility of the confidentiality of this discharge information lies with you and/or your care-partner. 

## 2018-12-11 NOTE — Progress Notes (Signed)
Called to room to assist during endoscopic procedure.  Patient ID and intended procedure confirmed with present staff. Received instructions for my participation in the procedure from the performing physician.  

## 2018-12-12 ENCOUNTER — Other Ambulatory Visit (HOSPITAL_COMMUNITY)
Admission: RE | Admit: 2018-12-12 | Discharge: 2018-12-12 | Disposition: A | Discharge status: Home / Self Care | Payer: BC Managed Care – PPO | Source: Ambulatory Visit | Attending: Cardiovascular Disease | Admitting: Cardiovascular Disease

## 2018-12-12 DIAGNOSIS — Z01812 Encounter for preprocedural laboratory examination: Secondary | ICD-10-CM | POA: Diagnosis not present

## 2018-12-12 DIAGNOSIS — Z20828 Contact with and (suspected) exposure to other viral communicable diseases: Secondary | ICD-10-CM | POA: Insufficient documentation

## 2018-12-12 LAB — SARS CORONAVIRUS 2 (TAT 6-24 HRS): SARS Coronavirus 2: NEGATIVE

## 2018-12-14 ENCOUNTER — Ambulatory Visit (HOSPITAL_BASED_OUTPATIENT_CLINIC_OR_DEPARTMENT_OTHER): Payer: BC Managed Care – PPO | Attending: Cardiology | Admitting: Cardiovascular Disease

## 2018-12-14 ENCOUNTER — Other Ambulatory Visit: Payer: Self-pay

## 2018-12-14 DIAGNOSIS — Z791 Long term (current) use of non-steroidal anti-inflammatories (NSAID): Secondary | ICD-10-CM | POA: Insufficient documentation

## 2018-12-14 DIAGNOSIS — R0683 Snoring: Secondary | ICD-10-CM | POA: Insufficient documentation

## 2018-12-14 DIAGNOSIS — G4730 Sleep apnea, unspecified: Secondary | ICD-10-CM | POA: Insufficient documentation

## 2018-12-14 DIAGNOSIS — I152 Hypertension secondary to endocrine disorders: Secondary | ICD-10-CM

## 2018-12-14 DIAGNOSIS — G478 Other sleep disorders: Secondary | ICD-10-CM | POA: Insufficient documentation

## 2018-12-14 DIAGNOSIS — I10 Essential (primary) hypertension: Secondary | ICD-10-CM

## 2018-12-14 DIAGNOSIS — Z7984 Long term (current) use of oral hypoglycemic drugs: Secondary | ICD-10-CM | POA: Diagnosis not present

## 2018-12-14 DIAGNOSIS — Z79899 Other long term (current) drug therapy: Secondary | ICD-10-CM | POA: Insufficient documentation

## 2018-12-14 DIAGNOSIS — R0902 Hypoxemia: Secondary | ICD-10-CM | POA: Diagnosis not present

## 2018-12-14 DIAGNOSIS — E1159 Type 2 diabetes mellitus with other circulatory complications: Secondary | ICD-10-CM

## 2018-12-15 ENCOUNTER — Other Ambulatory Visit: Payer: Self-pay

## 2018-12-15 ENCOUNTER — Other Ambulatory Visit (HOSPITAL_BASED_OUTPATIENT_CLINIC_OR_DEPARTMENT_OTHER): Payer: Self-pay

## 2018-12-15 ENCOUNTER — Telehealth: Payer: Self-pay | Admitting: *Deleted

## 2018-12-15 DIAGNOSIS — E1159 Type 2 diabetes mellitus with other circulatory complications: Secondary | ICD-10-CM

## 2018-12-15 DIAGNOSIS — G4730 Sleep apnea, unspecified: Secondary | ICD-10-CM

## 2018-12-15 NOTE — Telephone Encounter (Signed)
1. Have you developed a fever since your procedure? no  2.   Have you had an respiratory symptoms (SOB or cough) since your procedure? no  3.   Have you tested positive for COVID 19 since your procedure no  4.   Have you had any family members/close contacts diagnosed with the COVID 19 since your procedure?  No  Patient tested for COVID related to sleepstudy today. Test negative   If yes to any of these questions please route to Joylene John, RN and Alphonsa Gin, RN.  Follow up Call-  Call back number 12/11/2018  Post procedure Call Back phone  # [phone]  Permission to leave phone message Yes  Some recent data might be hidden     Patient questions:  Do you have a fever, pain , or abdominal swelling? No. Pain Score  0 *  Have you tolerated food without any problems? Yes.    Have you been able to return to your normal activities? Yes.    Do you have any questions about your discharge instructions: Diet   No. Medications  No. Follow up visit  No.  Do you have questions or concerns about your Care? No.  Actions: * If pain score is 4 or above: No action needed, pain <4.

## 2018-12-16 ENCOUNTER — Encounter: Payer: Self-pay | Admitting: Gastroenterology

## 2018-12-24 ENCOUNTER — Encounter (HOSPITAL_BASED_OUTPATIENT_CLINIC_OR_DEPARTMENT_OTHER): Payer: Self-pay | Admitting: Cardiovascular Disease

## 2018-12-24 NOTE — Procedures (Signed)
Patient Name: Marco Campbell, [place] Date: 12/14/2018 Gender: Male D.O.B: Oct 23, 1965 Age (years): 34 Referring Provider: Minus Breeding Height (inches): 27 Interpreting Physician: Shelva Majestic MD, ABSM Weight (lbs): 250 RPSGT: Earney [place] BMI: 33 MRN: [id number] Neck Size: 19.00  CLINICAL INFORMATION Sleep Study Type: NPSG  Indication for sleep study: Hypertension  Epworth Sleepiness Score: 3  SLEEP STUDY TECHNIQUE As per the AASM Manual for the Scoring of Sleep and Associated Events v2.3 (April 2016) with a hypopnea requiring 4% desaturations.  The channels recorded and monitored were frontal, central and occipital EEG, electrooculogram (EOG), submentalis EMG (chin), nasal and oral airflow, thoracic and abdominal wall motion, anterior tibialis EMG, snore microphone, electrocardiogram, and pulse oximetry.  MEDICATIONS     atorvastatin (LIPITOR) 20 MG tablet         BAYER MICROLET LANCETS lancets         blood glucose meter kit and supplies         EPINEPHrine 0.3 mg/0.3 mL IJ SOAJ injection         ibuprofen (ADVIL,MOTRIN) 600 MG tablet         lisinopril-hydrochlorothiazide (ZESTORETIC) 20-12.5 MG tablet         metFORMIN (GLUCOPHAGE-XR) 750 MG 24 hr tablet         ONE TOUCH ULTRA TEST test strip         varenicline (CHANTIX CONTINUING MONTH PAK) 1 MG tablet         varenicline (CHANTIX STARTING MONTH PAK) 0.5 MG X 11 & 1 MG X 42 tablet     Medications self-administered by patient taken the night of the study : N/A  SLEEP ARCHITECTURE The study was initiated at 10:20:18 PM and ended at 4:32:19 AM.  Sleep onset time was 75.3 minutes and the sleep efficiency was 78.5%%. The total sleep time was 292.2 minutes.  Stage REM latency was 66.0 minutes.  The patient spent 1.4%% of the night in stage N1 sleep, 88.9%% in stage N2 sleep, 0.0%% in stage N3 and 9.8% in REM.  Alpha intrusion was absent.  Supine sleep was 24.00%.  RESPIRATORY PARAMETERS The overall  apnea/hypopnea index (AHI) was 1.4 per hour. The respiratory disturbance index (RDI) was 2.7/h. There were 0 total apneas, including 0 obstructive, 0 central and 0 mixed apneas. There were 7 hypopneas and 6 RERAs.  The AHI during Stage REM sleep was 14.7 per hour.  AHI while supine was 0.0 per hour.  The mean oxygen saturation was 94.5%. The minimum SpO2 during sleep was 85.0%.  Loud snoring was noted during this study.  CARDIAC DATA The 2 lead EKG demonstrated sinus rhythm. The mean heart rate was 54.8 beats per minute. Other EKG findings include: None  LEG MOVEMENT DATA The total PLMS were 0 with a resulting PLMS index of 0.0. Associated arousal with leg movement index was 0.0 .  IMPRESSIONS - Increased Upper Airway resistance  syndrome (UARS) without significant obstructive sleep apnea overall (AHI 1.4/h, RDI 2.7/h). However there is mild-moderate sleep apnea during REM sleep (AHI 14.7/h). - No significant central sleep apnea occurred during this study (CAI = 0.0/h). - Mild oxygen desaturation was noted during this study (Min O2 = 85.0%). - The patient snored with loud snoring volume. - No cardiac abnormalities were noted during this study. - Clinically significant periodic limb movements did not occur during sleep. No significant associated arousals.  DIAGNOSIS - Increased Upper Airway Resistance Syndrome (UARS) - Sleep Apnea, unspecified type G47.30 - Nocturnal Hypoxemia (327.26 [G47.36  ICD-10])  RECOMMENDATIONS - At present patient does not meet criteria for CPAP. - Effort should be made to optimize nasal and oropharyngeal patency. - Consider initial alternatives to snoring. Patient may benefit from a customized oral appliance.  - Avoid alcohol, sedatives and other CNS depressants that may worsen sleep apnea and disrupt normal sleep architecture. - Sleep hygiene should be reviewed to assess factors that may improve sleep quality. - Weight management and regular exercise  should be initiated or continued if appropriate.  [Electronically signed] 12/24/2018 05:16 PM  Shelva Majestic MD, [facility], [place], American Board of Sleep Medicine   NPI: [id number]  [place] PH: [phone]   FX: [phone] [place]

## 2018-12-24 NOTE — Progress Notes (Signed)
His sleep study shows that he does not currently meet criteria to have a CPAP. He only has mild sleep apnea during REM sleep but no sign of obstructive sleep apnea. I recommend that he avoid sedatives or alcohol before bed and make sure he is eating a healthy diet and exercising to improve his health.

## 2018-12-25 NOTE — Progress Notes (Signed)
Pt was notified of results about sleep study and asked a copy be mailed to him

## 2018-12-28 ENCOUNTER — Other Ambulatory Visit: Payer: Self-pay | Admitting: Family Medicine

## 2019-01-05 NOTE — Progress Notes (Signed)
Subjective:    Patient ID: Marco Campbell, male    DOB: 1966/03/17, 53 y.o.   MRN: [id number]  Marco Campbell is a 53 y.o. male who presents for follow-up of Type 2 diabetes mellitus and other chronic health conditions.  Patient is not checking home blood sugars.   Home blood sugar records: patient does not check sugars How often is blood sugars being checked: none Current symptoms include: none. Patient denies increased appetite, nausea, visual disturbances, vomiting and weight loss.  Patient is checking their feet daily. Any Foot concerns (callous, ulcer, wound, thickened nails, toenail fungus, skin fungus, hammer toe): none  Last dilated eye exam: tomorrow 9/16  Current treatments: Metformin  Medication compliance: good  Current diet: in general, a "healthy" diet   Current exercise: walking 3-6 miles 2-3 times per week Known diabetic complications: none   HTN- reports good medication compliance and no side effects. He recently bought a BP cuff but does not think it is accurate. States it reads "high".   Denies chest pain, palpitations, shortness of breath, orthopnea, LE edema.   Recently saw his cardiologist. States he has Chantix at home but is not ready to stop smoking yet.  Has treadmill stress test scheduled for next week.    Diabetic eye exam is tomorrow.   The following portions of the patient's history were reviewed and updated as appropriate: allergies, current medications, past medical history, past social history and problem list.  ROS as in subjective above.     Objective:    Physical Exam Alert and in no distress otherwise not examined.  Blood pressure 140/80, pulse 63, temperature (!) 97.1 F (36.2 C), resp. rate 16, weight 255 lb 3.2 oz (115.8 kg).  Lab Review Diabetic Labs Latest Ref Rng & Units 01/06/2019 07/08/2018 07/02/2018 04/07/2018 09/18/2017  HbA1c 4.0 - 5.6 % 6.1(A) 6.6(A) - 6.3(A) 6.6(A)  Microalbumin Not estab mg/dL - - - - -  Micro/Creat Ratio  <30 mcg/mg creat - - - - -  Chol 100 - 199 mg/dL - - 185 - 186  HDL >39 mg/dL - - 49 - 47  Calc LDL 0 - 99 mg/dL - - 117(H) - 118(H)  Triglycerides 0 - 149 mg/dL - - 94 - 107  Creatinine 0.76 - 1.27 mg/dL - - 1.28(H) 1.16 1.01   BP/Weight 01/06/2019 12/14/2018 12/11/2018 11/27/2018 [id number]  Systolic BP [id number] - 123XX123 - 123XX123  Diastolic BP 80 - 70 - 78  Wt. (Lbs) 255.2 250 250 250 250.2  BMI 33.67 32.98 32.98 32.98 33.01   Foot/eye exam completion dates Latest Ref Rng & Units 07/02/2018 11/11/2017  Eye Exam No Retinopathy - No Retinopathy  Foot Form Completion - Done -    Marco Campbell  reports that he has been smoking cigarettes. He has smoked for the past 30.00 years. He has never used smokeless tobacco. He reports current alcohol use. He reports that he does not use drugs.     Assessment & Plan:    Controlled type 2 diabetes mellitus without complication, without long-term current use of insulin ([place]) - Plan: HgB A1c  Hypertension associated with diabetes (HCC)  Elevated LDL cholesterol level  Obesity (BMI 30-39.9)  Smoker  1. Rx changes: none Hgb A1c 6.1%  2. Education: Reviewed 'ABCs' of diabetes management (respective goals in parentheses):  A1C (<7), blood pressure (<130/80), and cholesterol (LDL <100). 3. Compliance at present is estimated to be good. Efforts to improve compliance (if necessary) will be directed at dietary modifications:  continue eating a healthy diet, low in carbohydrates and fat and increased exercise. 4. HTN- BP at upper normal, would like to see it lower. He will bring in his BP machine to compare against ours. Check BP at home regularly.  5. Smoking cessation encouraged to help lower risk for heart disease. He has Chantix at home and plans to stop at "some point".  6. Obesity- encouraged weight loss.  7. HL- continue on statin.  8. Follow up with cardiology as recommended.  9. Diabetic eye exam tomorrow 10. Follow up: 4 months

## 2019-01-06 ENCOUNTER — Other Ambulatory Visit: Payer: Self-pay

## 2019-01-06 ENCOUNTER — Ambulatory Visit: Payer: BC Managed Care – PPO | Admitting: Family Medicine

## 2019-01-06 ENCOUNTER — Encounter: Payer: Self-pay | Admitting: Family Medicine

## 2019-01-06 VITALS — BP 140/80 | HR 63 | Temp 97.1°F | Resp 16 | Wt 255.2 lb

## 2019-01-06 DIAGNOSIS — E7800 Pure hypercholesterolemia, unspecified: Secondary | ICD-10-CM

## 2019-01-06 DIAGNOSIS — F17200 Nicotine dependence, unspecified, uncomplicated: Secondary | ICD-10-CM

## 2019-01-06 DIAGNOSIS — E669 Obesity, unspecified: Secondary | ICD-10-CM | POA: Diagnosis not present

## 2019-01-06 DIAGNOSIS — E1159 Type 2 diabetes mellitus with other circulatory complications: Secondary | ICD-10-CM | POA: Diagnosis not present

## 2019-01-06 DIAGNOSIS — E119 Type 2 diabetes mellitus without complications: Secondary | ICD-10-CM | POA: Diagnosis not present

## 2019-01-06 DIAGNOSIS — I10 Essential (primary) hypertension: Secondary | ICD-10-CM

## 2019-01-06 DIAGNOSIS — I152 Hypertension secondary to endocrine disorders: Secondary | ICD-10-CM

## 2019-01-06 LAB — POCT GLYCOSYLATED HEMOGLOBIN (HGB A1C): Hemoglobin A1C: 6.1 % — AB (ref 4.0–5.6)

## 2019-01-08 LAB — HM DIABETES EYE EXAM

## 2019-01-13 ENCOUNTER — Other Ambulatory Visit (HOSPITAL_COMMUNITY)
Admission: RE | Admit: 2019-01-13 | Discharge: 2019-01-13 | Disposition: A | Discharge status: Home / Self Care | Payer: BC Managed Care – PPO | Source: Ambulatory Visit | Attending: Cardiology | Admitting: Cardiology

## 2019-01-13 DIAGNOSIS — Z01812 Encounter for preprocedural laboratory examination: Secondary | ICD-10-CM | POA: Insufficient documentation

## 2019-01-13 DIAGNOSIS — Z20828 Contact with and (suspected) exposure to other viral communicable diseases: Secondary | ICD-10-CM | POA: Insufficient documentation

## 2019-01-14 ENCOUNTER — Telehealth (HOSPITAL_COMMUNITY): Payer: Self-pay

## 2019-01-14 LAB — NOVEL CORONAVIRUS, NAA (HOSP ORDER, SEND-OUT TO REF LAB; TAT 18-24 HRS): SARS-CoV-2, NAA: NOT DETECTED

## 2019-01-14 NOTE — Telephone Encounter (Signed)
Encounter complete. 

## 2019-01-16 ENCOUNTER — Ambulatory Visit (HOSPITAL_COMMUNITY)
Admission: RE | Admit: 2019-01-16 | Discharge: 2019-01-16 | Disposition: A | Discharge status: Home / Self Care | Payer: BC Managed Care – PPO | Source: Ambulatory Visit | Attending: Cardiology | Admitting: Cardiology

## 2019-01-16 ENCOUNTER — Other Ambulatory Visit: Payer: Self-pay

## 2019-01-16 DIAGNOSIS — E1159 Type 2 diabetes mellitus with other circulatory complications: Secondary | ICD-10-CM | POA: Diagnosis present

## 2019-01-16 DIAGNOSIS — I10 Essential (primary) hypertension: Secondary | ICD-10-CM | POA: Diagnosis present

## 2019-01-16 DIAGNOSIS — Z8249 Family history of ischemic heart disease and other diseases of the circulatory system: Secondary | ICD-10-CM

## 2019-01-16 DIAGNOSIS — I152 Hypertension secondary to endocrine disorders: Secondary | ICD-10-CM

## 2019-01-19 LAB — EXERCISE TOLERANCE TEST
Estimated workload: 7.8 METS
Exercise duration (min): 6 min
Exercise duration (sec): 33 s
MPHR: 167 {beats}/min
Peak HR: 150 {beats}/min
Percent HR: 89 %
RPE: 18
Rest HR: 75 {beats}/min

## 2019-01-22 ENCOUNTER — Encounter: Payer: Self-pay | Admitting: Cardiology

## 2019-01-22 ENCOUNTER — Telehealth: Payer: Self-pay

## 2019-01-22 DIAGNOSIS — I409 Acute myocarditis, unspecified: Secondary | ICD-10-CM

## 2019-01-22 NOTE — Telephone Encounter (Signed)
-----   Message from Minus Breeding, MD sent at 01/20/2019  4:58 PM EDT ----- He had no evidence of ischemic heart disease on the stress test.  However, he did have some ventricular ectopy in recovery.  Given the family history of cardiomyopathy, I would like to screen with a Lexiscan Myoview.

## 2019-01-22 NOTE — Telephone Encounter (Signed)
Gave results and educated pt. Verbalized understanding. Put in [place] order.

## 2019-01-27 ENCOUNTER — Telehealth (HOSPITAL_COMMUNITY): Payer: Self-pay

## 2019-01-27 NOTE — Telephone Encounter (Signed)
Encounter complete. 

## 2019-01-27 NOTE — Telephone Encounter (Addendum)
° ° °  Please return call to patient °

## 2019-01-29 ENCOUNTER — Ambulatory Visit (HOSPITAL_COMMUNITY)
Admission: RE | Admit: 2019-01-29 | Discharge: 2019-01-29 | Disposition: A | Discharge status: Home / Self Care | Payer: BC Managed Care – PPO | Source: Ambulatory Visit | Attending: Cardiology | Admitting: Cardiology

## 2019-01-29 ENCOUNTER — Other Ambulatory Visit: Payer: Self-pay

## 2019-01-29 DIAGNOSIS — I409 Acute myocarditis, unspecified: Secondary | ICD-10-CM | POA: Insufficient documentation

## 2019-01-29 LAB — MYOCARDIAL PERFUSION IMAGING
LV dias vol: 148 mL (ref 62–150)
LV sys vol: 79 mL
Peak HR: 89 {beats}/min
Rest HR: 57 {beats}/min
SDS: 0
SRS: 0
SSS: 0
TID: 1.03

## 2019-01-29 MED ORDER — TECHNETIUM TC 99M TETROFOSMIN IV KIT
9.8000 | PACK | Freq: Once | INTRAVENOUS | Status: AC | PRN
  Administered 2019-01-29: 9.8 via INTRAVENOUS
  Filled 2019-01-29: qty 10

## 2019-01-29 MED ORDER — TECHNETIUM TC 99M TETROFOSMIN IV KIT
29.3000 | PACK | Freq: Once | INTRAVENOUS | Status: AC | PRN
  Administered 2019-01-29: 29.3 via INTRAVENOUS
  Filled 2019-01-29: qty 30

## 2019-01-29 MED ORDER — REGADENOSON 0.4 MG/5ML IV SOLN
0.4000 mg | Freq: Once | INTRAVENOUS | Status: AC
  Administered 2019-01-29: 0.4 mg via INTRAVENOUS

## 2019-01-30 ENCOUNTER — Encounter: Payer: Self-pay | Admitting: Internal Medicine

## 2019-02-02 ENCOUNTER — Telehealth: Payer: Self-pay

## 2019-02-02 DIAGNOSIS — I5020 Unspecified systolic (congestive) heart failure: Secondary | ICD-10-CM

## 2019-02-02 NOTE — Telephone Encounter (Signed)
-----   Message from Minus Breeding, MD sent at 02/01/2019 10:43 AM EDT ----- No ischemia.  However, the EF seems to be mildly low.  This will need to be followed up with an echocardiogram.  Call Mr. Brandle with the results and send results to Girtha Rm, NP-C

## 2019-02-02 NOTE — Telephone Encounter (Signed)
Gave results to pt. Verbalized understanding. Put in orders for echo. Sent results to PCP.

## 2019-02-03 ENCOUNTER — Ambulatory Visit (HOSPITAL_COMMUNITY): Payer: BC Managed Care – PPO | Attending: Cardiology

## 2019-02-03 ENCOUNTER — Other Ambulatory Visit: Payer: Self-pay

## 2019-02-03 ENCOUNTER — Encounter (INDEPENDENT_AMBULATORY_CARE_PROVIDER_SITE_OTHER): Payer: Self-pay

## 2019-02-03 DIAGNOSIS — I5020 Unspecified systolic (congestive) heart failure: Secondary | ICD-10-CM

## 2019-02-08 ENCOUNTER — Other Ambulatory Visit: Payer: Self-pay | Admitting: Family Medicine

## 2019-02-08 DIAGNOSIS — I10 Essential (primary) hypertension: Secondary | ICD-10-CM

## 2019-03-24 ENCOUNTER — Other Ambulatory Visit: Payer: Self-pay | Admitting: Family Medicine

## 2019-04-09 ENCOUNTER — Other Ambulatory Visit: Payer: Self-pay | Admitting: Family Medicine

## 2019-05-03 ENCOUNTER — Other Ambulatory Visit: Payer: Self-pay | Admitting: Family Medicine

## 2019-05-03 DIAGNOSIS — I10 Essential (primary) hypertension: Secondary | ICD-10-CM

## 2019-05-05 NOTE — Progress Notes (Signed)
Subjective:    Patient ID: Marco Campbell, male    DOB: 1966-03-17, 54 y.o.   MRN: [id number]  Marco Campbell is a 54 y.o. male who presents for follow-up of Type 2 diabetes mellitus.  HTN- taking lisinopril- HCTZ daily  HL- reports taking atorvastatin 20 mg daily   He is still smoking and has not started Chantix yet. Knows he should stop.   Recent cardiology work up negative.   Patient is not checking home blood sugars.   Home blood sugar records: patient does not check sugars How often is blood sugars being checked: not checking Current symptoms include: none. Patient denies increased appetite, nausea, visual disturbances, vomiting and weight loss.  Patient is checking their feet daily. Any Foot concerns (callous, ulcer, wound, thickened nails, toenail fungus, skin fungus, hammer toe): none Last dilated eye exam: 2 months ago  Current treatments: doing well on metformin daily  Medication compliance: fair  Current diet: in general, a "healthy" diet   Current exercise: walking Known diabetic complications: none  The following portions of the patient's history were reviewed and updated as appropriate: allergies, current medications, past medical history, past social history and problem list.  ROS as in subjective above.     Objective:    Physical Exam Alert and in no distress otherwise not examined.  Blood pressure 122/80, pulse 100, weight 259 lb 9.6 oz (117.8 kg).  Lab Review Diabetic Labs Latest Ref Rng & Units 05/06/2019 01/06/2019 07/08/2018 07/02/2018 04/07/2018  HbA1c 4.0 - 5.6 % 6.6(A) 6.1(A) 6.6(A) - 6.3(A)  Microalbumin Not estab mg/dL - - - - -  Micro/Creat Ratio 0 - 29 mg/g creat - - - 6 -  Chol 100 - 199 mg/dL - - - 185 -  HDL >39 mg/dL - - - 49 -  Calc LDL 0 - 99 mg/dL - - - 117(H) -  Triglycerides 0 - 149 mg/dL - - - 94 -  Creatinine 0.76 - 1.27 mg/dL - - - 1.28(H) 1.16   BP/Weight 05/06/2019 01/29/2019 01/06/2019 12/14/2018 [id number]  Systolic BP 123XX123 - [id number] -  123XX123  Diastolic BP 80 - 80 - 70  Wt. (Lbs) 259.6 255 255.2 250 250  BMI 34.25 33.64 33.67 32.98 32.98   Foot/eye exam completion dates Latest Ref Rng & Units 01/08/2019 07/02/2018  Eye Exam No Retinopathy No Retinopathy -  Foot Form Completion - - Done    Marco Campbell  reports that he has been smoking cigarettes. He has smoked for the past 30.00 years. He has never used smokeless tobacco. He reports current alcohol use. He reports that he does not use drugs.     Assessment & Plan:    Controlled type 2 diabetes mellitus without complication, without long-term current use of insulin ([place]) - Plan: HgB A1c, CBC with Differential, Comprehensive metabolic panel  Hypertension associated with diabetes ([place]) - Plan: CBC with Differential, Comprehensive metabolic panel  Elevated LDL cholesterol level  Smoker  Obesity (BMI 30-39.9)  1. Rx changes: none Hgb A1c 6.6%  2. Education: Reviewed 'ABCs' of diabetes management (respective goals in parentheses):  A1C (<7), blood pressure (<130/80), and cholesterol (LDL <100). 3. Compliance at present is estimated to be good. Efforts to improve compliance (if necessary) will be directed at dietary modifications: cut back on sugar and carbohydrates and increased exercise. 4. HTN- well controlled  5. HL- continue statin therapy 6. Smoking- encouraged him to stop. He has Chantix at home.  7. Follow up: 4 months

## 2019-05-06 ENCOUNTER — Ambulatory Visit (INDEPENDENT_AMBULATORY_CARE_PROVIDER_SITE_OTHER): Payer: BC Managed Care – PPO | Admitting: Family Medicine

## 2019-05-06 ENCOUNTER — Encounter: Payer: Self-pay | Admitting: Family Medicine

## 2019-05-06 ENCOUNTER — Other Ambulatory Visit: Payer: Self-pay

## 2019-05-06 VITALS — BP 122/80 | HR 100 | Wt 259.6 lb

## 2019-05-06 DIAGNOSIS — E1159 Type 2 diabetes mellitus with other circulatory complications: Secondary | ICD-10-CM | POA: Diagnosis not present

## 2019-05-06 DIAGNOSIS — E7800 Pure hypercholesterolemia, unspecified: Secondary | ICD-10-CM | POA: Diagnosis not present

## 2019-05-06 DIAGNOSIS — E669 Obesity, unspecified: Secondary | ICD-10-CM

## 2019-05-06 DIAGNOSIS — E119 Type 2 diabetes mellitus without complications: Secondary | ICD-10-CM | POA: Diagnosis not present

## 2019-05-06 DIAGNOSIS — I152 Hypertension secondary to endocrine disorders: Secondary | ICD-10-CM

## 2019-05-06 DIAGNOSIS — I10 Essential (primary) hypertension: Secondary | ICD-10-CM

## 2019-05-06 DIAGNOSIS — F17200 Nicotine dependence, unspecified, uncomplicated: Secondary | ICD-10-CM

## 2019-05-06 LAB — POCT GLYCOSYLATED HEMOGLOBIN (HGB A1C): Hemoglobin A1C: 6.6 % — AB (ref 4.0–5.6)

## 2019-05-06 NOTE — Patient Instructions (Addendum)
Your hemoglobin A1c is 6.6% today.   Try to cut back on carbohydrates and sugar and continue being physically active.   I do recommend that you stop smoking. This will help your overall health.   I will see you back for your annual physical and diabetes follow up in 4 months.

## 2019-05-07 LAB — CBC WITH DIFFERENTIAL/PLATELET
Basophils Absolute: 0 10*3/uL (ref 0.0–0.2)
Basos: 0 %
EOS (ABSOLUTE): 0.2 10*3/uL (ref 0.0–0.4)
Eos: 2 %
Hematocrit: 46.2 % (ref 37.5–51.0)
Hemoglobin: 15.2 g/dL (ref 13.0–17.7)
Immature Grans (Abs): 0 10*3/uL (ref 0.0–0.1)
Immature Granulocytes: 0 %
Lymphocytes Absolute: 3.5 10*3/uL — ABNORMAL HIGH (ref 0.7–3.1)
Lymphs: 32 %
MCH: 28.9 pg (ref 26.6–33.0)
MCHC: 32.9 g/dL (ref 31.5–35.7)
MCV: 88 fL (ref 79–97)
Monocytes Absolute: 1.1 10*3/uL — ABNORMAL HIGH (ref 0.1–0.9)
Monocytes: 10 %
Neutrophils Absolute: 6.2 10*3/uL (ref 1.4–7.0)
Neutrophils: 56 %
Platelets: 275 10*3/uL (ref 150–450)
RBC: 5.26 x10E6/uL (ref 4.14–5.80)
RDW: 12.3 % (ref 11.6–15.4)
WBC: 11 10*3/uL — ABNORMAL HIGH (ref 3.4–10.8)

## 2019-05-07 LAB — COMPREHENSIVE METABOLIC PANEL
ALT: 17 IU/L (ref 0–44)
AST: 16 IU/L (ref 0–40)
Albumin/Globulin Ratio: 1.5 (ref 1.2–2.2)
Albumin: 4 g/dL (ref 3.8–4.9)
Alkaline Phosphatase: 69 IU/L (ref 39–117)
BUN/Creatinine Ratio: 16 (ref 9–20)
BUN: 20 mg/dL (ref 6–24)
Bilirubin Total: 0.2 mg/dL (ref 0.0–1.2)
CO2: 23 mmol/L (ref 20–29)
Calcium: 9.4 mg/dL (ref 8.7–10.2)
Chloride: 103 mmol/L (ref 96–106)
Creatinine, Ser: 1.22 mg/dL (ref 0.76–1.27)
GFR calc Af Amer: 78 mL/min/{1.73_m2} (ref >59)
GFR calc non Af Amer: 67 mL/min/{1.73_m2} (ref >59)
Globulin, Total: 2.7 g/dL (ref 1.5–4.5)
Glucose: 150 mg/dL — ABNORMAL HIGH (ref 65–99)
Potassium: 4 mmol/L (ref 3.5–5.2)
Sodium: 140 mmol/L (ref 134–144)
Total Protein: 6.7 g/dL (ref 6.0–8.5)

## 2019-05-19 ENCOUNTER — Other Ambulatory Visit: Payer: Self-pay | Admitting: Family Medicine

## 2019-05-26 ENCOUNTER — Ambulatory Visit: Payer: BC Managed Care – PPO | Admitting: Family Medicine

## 2019-05-26 ENCOUNTER — Encounter: Payer: Self-pay | Admitting: Family Medicine

## 2019-05-26 ENCOUNTER — Other Ambulatory Visit: Payer: Self-pay

## 2019-05-26 VITALS — BP 149/82 | HR 83 | Temp 97.6°F | Wt 257.6 lb

## 2019-05-26 DIAGNOSIS — Z202 Contact with and (suspected) exposure to infections with a predominantly sexual mode of transmission: Secondary | ICD-10-CM | POA: Diagnosis not present

## 2019-05-26 NOTE — Progress Notes (Signed)
   Subjective:    Patient ID: Marco Campbell, male    DOB: [birth year], 54 y.o.   MRN: [id number]  HPI Chief Complaint  Patient presents with  . std exposure    std exposure of trich   States his partner was recently told by her OB/GYN that she had trichomonas.  He denies any symptoms.  Would like to be tested Denies fever, chills, abdominal pain, nausea, vomiting or diarrhea.  No urinary symptoms.  His blood pressure is elevated today and he states this is due to stress.  He was here in mid January and his blood pressure was in goal range.   Review of Systems Pertinent positives and negatives in the history of present illness.     Objective:   Physical Exam BP (!) 149/82 Comment: pt's cuff  Pulse 83   Temp 97.6 F (36.4 C)   Wt 257 lb 9.6 oz (116.8 kg)   BMI 33.99 kg/m   Alert and oriented and in no acute distress.  Otherwise not examined.      Assessment & Plan:  Possible exposure to STD - Plan: GC/Chlamydia Probe Amp, Trichomonas vaginalis, RNA, RPR, HIV Antibody (routine testing w rflx)  Advised him to avoid any sexual activity until his results are back.  He will monitor his blood pressure and make sure it is returning back to goal range. Follow-up pending results

## 2019-05-27 LAB — RPR: RPR Ser Ql: NONREACTIVE

## 2019-05-27 LAB — HIV ANTIBODY (ROUTINE TESTING W REFLEX): HIV Screen 4th Generation wRfx: NONREACTIVE

## 2019-05-28 LAB — GC/CHLAMYDIA PROBE AMP
Chlamydia trachomatis, NAA: NEGATIVE
Neisseria Gonorrhoeae by PCR: NEGATIVE

## 2019-05-28 LAB — TRICHOMONAS VAGINALIS, PROBE AMP: Trich vag by NAA: NEGATIVE

## 2019-05-28 NOTE — Progress Notes (Signed)
He is negative for trichomonas, HIV and syphilis. I am still waiting on gonorrhea and chlamydia results.

## 2019-06-10 ENCOUNTER — Ambulatory Visit: Payer: BC Managed Care – PPO | Admitting: Family Medicine

## 2019-06-17 ENCOUNTER — Other Ambulatory Visit: Payer: Self-pay | Admitting: Family Medicine

## 2019-07-02 ENCOUNTER — Other Ambulatory Visit: Payer: Self-pay | Admitting: Family Medicine

## 2019-07-03 ENCOUNTER — Other Ambulatory Visit: Payer: Self-pay | Admitting: Family Medicine

## 2019-07-03 DIAGNOSIS — I10 Essential (primary) hypertension: Secondary | ICD-10-CM

## 2019-09-14 NOTE — Patient Instructions (Addendum)
Stop taking the Metformin.  Let me know in 2 weeks if the diarrhea has stopped.  You can start taking [place] once daily as discussed.  Take this with your morning meal. Take the coupon with you to your pharmacy for the first 30-day prescription and it should be free. Find out if the medication is affordable and if not, let me know.  Continue with a healthy diet and exercise.  I recommend that you stop smoking.  Return for your labs and urine check.  You can also bring in the stool cards  Follow-up in 6 months or sooner if needed.   Preventive Care 48-68 Years Old, Male Preventive care refers to lifestyle choices and visits with your health care provider that can promote health and wellness. This includes:  A yearly physical exam. This is also called an annual well check.  Regular dental and eye exams.  Immunizations.  Screening for certain conditions.  Healthy lifestyle choices, such as eating a healthy diet, getting regular exercise, not using drugs or products that contain nicotine and tobacco, and limiting alcohol use. What can I expect for my preventive care visit? Physical exam Your health care provider will check:  Height and weight. These may be used to calculate body mass index (BMI), which is a measurement that tells if you are at a healthy weight.  Heart rate and blood pressure.  Your skin for abnormal spots. Counseling Your health care provider may ask you questions about:  Alcohol, tobacco, and drug use.  Emotional well-being.  Home and relationship well-being.  Sexual activity.  Eating habits.  Work and work Statistician. What immunizations do I need?  Influenza (flu) vaccine  This is recommended every year. Tetanus, diphtheria, and pertussis (Tdap) vaccine  You may need a Td booster every 10 years. Varicella (chickenpox) vaccine  You may need this vaccine if you have not already been vaccinated. Zoster (shingles) vaccine  You may need this  after age 50. Measles, mumps, and rubella (MMR) vaccine  You may need at least one dose of MMR if you were born in 1957 or later. You may also need a second dose. Pneumococcal conjugate (PCV13) vaccine  You may need this if you have certain conditions and were not previously vaccinated. Pneumococcal polysaccharide (PPSV23) vaccine  You may need one or two doses if you smoke cigarettes or if you have certain conditions. Meningococcal conjugate (MenACWY) vaccine  You may need this if you have certain conditions. Hepatitis A vaccine  You may need this if you have certain conditions or if you travel or work in places where you may be exposed to hepatitis A. Hepatitis B vaccine  You may need this if you have certain conditions or if you travel or work in places where you may be exposed to hepatitis B. Haemophilus influenzae type b (Hib) vaccine  You may need this if you have certain risk factors. Human papillomavirus (HPV) vaccine  If recommended by your health care provider, you may need three doses over 6 months. You may receive vaccines as individual doses or as more than one vaccine together in one shot (combination vaccines). Talk with your health care provider about the risks and benefits of combination vaccines. What tests do I need? Blood tests  Lipid and cholesterol levels. These may be checked every 5 years, or more frequently if you are over 11 years old.  Hepatitis C test.  Hepatitis B test. Screening  Lung cancer screening. You may have this screening every year  starting at age 45 if you have a 30-pack-year history of smoking and currently smoke or have quit within the past 15 years.  Prostate cancer screening. Recommendations will vary depending on your family history and other risks.  Colorectal cancer screening. All adults should have this screening starting at age 73 and continuing until age 63. Your health care provider may recommend screening at age 22 if you are  at increased risk. You will have tests every 1-10 years, depending on your results and the type of screening test.  Diabetes screening. This is done by checking your blood sugar (glucose) after you have not eaten for a while (fasting). You may have this done every 1-3 years.  Sexually transmitted disease (STD) testing. Follow these instructions at home: Eating and drinking  Eat a diet that includes fresh fruits and vegetables, whole grains, lean protein, and low-fat dairy products.  Take vitamin and mineral supplements as recommended by your health care provider.  Do not drink alcohol if your health care provider tells you not to drink.  If you drink alcohol: ? Limit how much you have to 0-2 drinks a day. ? Be aware of how much alcohol is in your drink. In the [place] one drink equals one 12 oz bottle of beer (355 mL), one 5 oz glass of wine (148 mL), or one 1 oz glass of hard liquor (44 mL). Lifestyle  Take daily care of your teeth and gums.  Stay active. Exercise for at least 30 minutes on 5 or more days each week.  Do not use any products that contain nicotine or tobacco, such as cigarettes, e-cigarettes, and chewing tobacco. If you need help quitting, ask your health care provider.  If you are sexually active, practice safe sex. Use a condom or other form of protection to prevent STIs (sexually transmitted infections).  Talk with your health care provider about taking a low-dose aspirin every day starting at age 59. What's next?  Go to your health care provider once a year for a well check visit.  Ask your health care provider how often you should have your eyes and teeth checked.  Stay up to date on all vaccines. This information is not intended to replace advice given to you by your health care provider. Make sure you discuss any questions you have with your health care provider. Document Revised: 04/03/2018 Document Reviewed: 04/03/2018 Elsevier Patient Education  2020  [employer].

## 2019-09-14 NOTE — Progress Notes (Signed)
Subjective:    Patient ID: Marco Campbell, male    DOB: [birth year], 54 y.o.   MRN: [id number]  HPI Chief Complaint  Patient presents with  . cpe    cpe- diabetes. metformin is giving him diarrhea   He is new to the practice and here for a complete physical exam and to follow up on chronic health conditions.  Last CPE: 07/02/2018  Other providers: Cardiology- Dr. Percival Spanish  GI- Dr. Joen [facility]   Diabetes- states metformin has been causing him to have diarrhea for the past year. He would like to switch to a different medication.  He does not check BS at home.  Eating a low carb diet in general and exercises on the weekend.  Denies any symptoms.  Eye exam is UTD.   HTN- taking medications daily without any side effects.   HL- taking statin without concerns.   Is not ready to stop smoking. Still smoking 10 per day.    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 Works at the [employer].   Denies drug use.  Occasional alcohol.  Diet: fairly healthy  Exercise: weekends he gets 5-6 miles per day.   Immunizations: Pfizer Covid vaccines received. Tdap and pneumonia UTD  Health maintenance:  Colonoscopy: 12/11/2018. Recall in 7 years  Last PSA: 06/2018 Last Dental Exam: 2 months ago  Last Eye Exam: 01/09/2019  Wears seatbelt always,  smoke detectors in home and functioning, does not text while driving, feels safe in home environment.  Reviewed allergies, medications, past medical, surgical, family, and social history.   Review of Systems Review of Systems Constitutional: -fever, -chills, -sweats, -unexpected weight change,-fatigue ENT: -runny nose, -ear pain, -sore throat Cardiology:  -chest pain, -palpitations, -edema Respiratory: -cough, -shortness of breath, -wheezing Gastroenterology: -abdominal pain, -nausea, -vomiting, -diarrhea, -constipation   Hematology: -bleeding or bruising problems Musculoskeletal: -arthralgias, -myalgias, -joint swelling, -back pain Ophthalmology: -vision changes Urology: -dysuria, -difficulty urinating, -hematuria, -urinary frequency, -urgency Neurology: -headache, -weakness, -tingling, -numbness       Objective:   Physical Exam BP 120/82   Pulse 78   Ht 6\' 1"  (1.854 m)   Wt 246 lb 12.8 oz (111.9 kg)   BMI 32.56 kg/m   General Appearance:    Alert, cooperative, no distress, appears stated age  Head:    Normocephalic, without obvious abnormality, atraumatic  Eyes:    PERRL, conjunctiva/corneas clear, EOM's intact, fundi    benign  Ears:    Normal TM's and external ear canals  Nose:   Mask in place   Throat:   Mask in place   Neck:   Supple, no lymphadenopathy;  thyroid:  no   enlargement/tenderness/nodules; no JVD  Back:    Spine nontender, no curvature, ROM normal, no CVA     tenderness  Lungs:     Clear to auscultation bilaterally without wheezes, rales or     ronchi; respirations unlabored  Chest Wall:    No tenderness or deformity   Heart:    Regular rate and rhythm, S1 and S2 normal, no murmur, rub   or gallop  Breast Exam:    No chest wall tenderness, masses or gynecomastia  Abdomen:     Soft, non-tender, nondistended, normoactive bowel sounds,    no masses, no hepatosplenomegaly  Genitalia:    Declines   Rectal:    Declines   Extremities:   No clubbing,  cyanosis or edema  Pulses:   2+ and symmetric all extremities  Skin:   Skin color, texture, turgor normal, no rashes or lesions  Lymph nodes:   Cervical, supraclavicular, and axillary nodes normal  Neurologic:   CNII-XII intact, normal strength, sensation and gait; reflexes 2+ and symmetric throughout          Psych:   Normal mood, affect, hygiene and grooming.         Assessment & Plan:  Routine general medical examination at a health care facility - Plan: CBC with Differential/Platelet, Comprehensive metabolic panel -He is here  today for a CPE.  He is not fasting and would like to return for fasting labs.  His significant other is with him today. Preventive health care reviewed.  He is up-to-date on colonoscopy.  His significant other would like for him to do stool cards to screen for colon cancer so I did discuss this and he will return in that his convenience. PSA normal last year.  We will also check a PSA this year to look for trend Immunizations reviewed and he is up-to-date. Counseling on healthy lifestyle including diet, exercise and smoking cessation. Discussed safety and health promotion.  Controlled type 2 diabetes mellitus without complication, without long-term current use of insulin ([place]) - Plan: HgB A1c, dapagliflozin propanediol (FARXIGA) 5 MG TABS tablet, CBC with Differential/Platelet, Comprehensive metabolic panel, Microalbumin / creatinine urine ratio, Lipid panel, TSH -Hemoglobin A1c 6.3% and his diabetes is well controlled.  Reports diarrhea for the past year with Metformin.  We switched him from regular Metformin to extended release and he has still had this issue.  We will stop the Metformin and switch him to [place].  Continue with low-carb and healthy diet as well as regular exercise.  He does not check blood sugars at home and I am okay with this. Diabetic eye exams up-to-date. Foot exam done today and normal. Urine microalbumin ordered.  Hypertension associated with diabetes ([place]) - Plan: CBC with Differential/Platelet, Comprehensive metabolic panel -Blood pressure in goal range and hypertension controlled.  Continue on medication.  Elevated LDL cholesterol level - Plan: Lipid panel -Continue statin therapy check lipid panel and follow-up  Smoker -Discussed smoking cessation and he is not ready to stop.  Screening for prostate cancer - Plan: PSA -Done per guidelines  Screen for colon cancer - Plan: POCT occult blood stool -Follow-up pending result.  Dr. Havery Moros is his GI

## 2019-09-15 ENCOUNTER — Encounter: Payer: Self-pay | Admitting: Family Medicine

## 2019-09-15 ENCOUNTER — Ambulatory Visit: Payer: BC Managed Care – PPO | Admitting: Family Medicine

## 2019-09-15 ENCOUNTER — Other Ambulatory Visit: Payer: Self-pay

## 2019-09-15 VITALS — BP 120/82 | HR 78 | Ht 73.0 in | Wt 246.8 lb

## 2019-09-15 DIAGNOSIS — Z1211 Encounter for screening for malignant neoplasm of colon: Secondary | ICD-10-CM

## 2019-09-15 DIAGNOSIS — Z125 Encounter for screening for malignant neoplasm of prostate: Secondary | ICD-10-CM | POA: Diagnosis not present

## 2019-09-15 DIAGNOSIS — Z0000 Encounter for general adult medical examination without abnormal findings: Secondary | ICD-10-CM | POA: Diagnosis not present

## 2019-09-15 DIAGNOSIS — E119 Type 2 diabetes mellitus without complications: Secondary | ICD-10-CM | POA: Diagnosis not present

## 2019-09-15 DIAGNOSIS — I10 Essential (primary) hypertension: Secondary | ICD-10-CM

## 2019-09-15 DIAGNOSIS — F17200 Nicotine dependence, unspecified, uncomplicated: Secondary | ICD-10-CM

## 2019-09-15 DIAGNOSIS — I152 Hypertension secondary to endocrine disorders: Secondary | ICD-10-CM

## 2019-09-15 DIAGNOSIS — E1159 Type 2 diabetes mellitus with other circulatory complications: Secondary | ICD-10-CM | POA: Diagnosis not present

## 2019-09-15 DIAGNOSIS — E7800 Pure hypercholesterolemia, unspecified: Secondary | ICD-10-CM

## 2019-09-15 LAB — POCT GLYCOSYLATED HEMOGLOBIN (HGB A1C): Hemoglobin A1C: 6.3 % — AB (ref 4.0–5.6)

## 2019-09-15 MED ORDER — DAPAGLIFLOZIN PROPANEDIOL 5 MG PO TABS: 5.0000 mg | ORAL_TABLET | Freq: Every day | ORAL | 0 refills | Status: DC

## 2019-09-18 ENCOUNTER — Other Ambulatory Visit: Payer: BC Managed Care – PPO

## 2019-09-18 ENCOUNTER — Other Ambulatory Visit: Payer: Self-pay

## 2019-09-18 DIAGNOSIS — Z125 Encounter for screening for malignant neoplasm of prostate: Secondary | ICD-10-CM

## 2019-09-18 DIAGNOSIS — E1159 Type 2 diabetes mellitus with other circulatory complications: Secondary | ICD-10-CM

## 2019-09-18 DIAGNOSIS — E119 Type 2 diabetes mellitus without complications: Secondary | ICD-10-CM

## 2019-09-18 DIAGNOSIS — Z0000 Encounter for general adult medical examination without abnormal findings: Secondary | ICD-10-CM

## 2019-09-18 DIAGNOSIS — E7800 Pure hypercholesterolemia, unspecified: Secondary | ICD-10-CM

## 2019-09-19 LAB — COMPREHENSIVE METABOLIC PANEL
ALT: 16 IU/L (ref 0–44)
AST: 17 IU/L (ref 0–40)
Albumin/Globulin Ratio: 1.4 (ref 1.2–2.2)
Albumin: 3.9 g/dL (ref 3.8–4.9)
Alkaline Phosphatase: 65 IU/L (ref 48–121)
BUN/Creatinine Ratio: 17 (ref 9–20)
BUN: 19 mg/dL (ref 6–24)
Bilirubin Total: 0.3 mg/dL (ref 0.0–1.2)
CO2: 21 mmol/L (ref 20–29)
Calcium: 9.2 mg/dL (ref 8.7–10.2)
Chloride: 106 mmol/L (ref 96–106)
Creatinine, Ser: 1.1 mg/dL (ref 0.76–1.27)
GFR calc Af Amer: 88 mL/min/{1.73_m2} (ref >59)
GFR calc non Af Amer: 76 mL/min/{1.73_m2} (ref >59)
Globulin, Total: 2.8 g/dL (ref 1.5–4.5)
Glucose: 95 mg/dL (ref 65–99)
Potassium: 4.1 mmol/L (ref 3.5–5.2)
Sodium: 141 mmol/L (ref 134–144)
Total Protein: 6.7 g/dL (ref 6.0–8.5)

## 2019-09-19 LAB — CBC WITH DIFFERENTIAL/PLATELET
Basophils Absolute: 0.1 10*3/uL (ref 0.0–0.2)
Basos: 1 %
EOS (ABSOLUTE): 0.2 10*3/uL (ref 0.0–0.4)
Eos: 2 %
Hematocrit: 46.3 % (ref 37.5–51.0)
Hemoglobin: 15.5 g/dL (ref 13.0–17.7)
Immature Grans (Abs): 0 10*3/uL (ref 0.0–0.1)
Immature Granulocytes: 0 %
Lymphocytes Absolute: 2.9 10*3/uL (ref 0.7–3.1)
Lymphs: 30 %
MCH: 29.5 pg (ref 26.6–33.0)
MCHC: 33.5 g/dL (ref 31.5–35.7)
MCV: 88 fL (ref 79–97)
Monocytes Absolute: 1 10*3/uL — ABNORMAL HIGH (ref 0.1–0.9)
Monocytes: 10 %
Neutrophils Absolute: 5.4 10*3/uL (ref 1.4–7.0)
Neutrophils: 57 %
Platelets: 240 10*3/uL (ref 150–450)
RBC: 5.25 x10E6/uL (ref 4.14–5.80)
RDW: 12.8 % (ref 11.6–15.4)
WBC: 9.5 10*3/uL (ref 3.4–10.8)

## 2019-09-19 LAB — MICROALBUMIN / CREATININE URINE RATIO
Creatinine, Urine: 76.7 mg/dL
Microalb/Creat Ratio: 4 mg/g creat (ref 0–29)
Microalbumin, Urine: 3.1 ug/mL

## 2019-09-19 LAB — LIPID PANEL
Chol/HDL Ratio: 3.1 ratio (ref 0.0–5.0)
Cholesterol, Total: 169 mg/dL (ref 100–199)
HDL: 54 mg/dL (ref >39)
LDL Chol Calc (NIH): 96 mg/dL (ref 0–99)
Triglycerides: 104 mg/dL (ref 0–149)
VLDL Cholesterol Cal: 19 mg/dL (ref 5–40)

## 2019-09-19 LAB — TSH: TSH: 2.15 u[IU]/mL (ref 0.450–4.500)

## 2019-09-19 LAB — PSA: Prostate Specific Ag, Serum: 0.9 ng/mL (ref 0.0–4.0)

## 2019-09-21 NOTE — Progress Notes (Signed)
His labs are good overall. His cholesterol has improved and I recommend he continue on the atorvastatin. We would like for his LDL or bad cholesterol to be even lower to give him the best protection against heart disease so I recommend limiting his fatty foods, fried foods and try to get at least 150 minutes of physical activity during the week. I know he is active on the weekends and I would try to add walking or some type of exercise one or two days during the week.

## 2019-09-26 ENCOUNTER — Other Ambulatory Visit: Payer: Self-pay | Admitting: Family Medicine

## 2019-10-16 ENCOUNTER — Encounter: Payer: Self-pay | Admitting: Family Medicine

## 2019-10-16 ENCOUNTER — Other Ambulatory Visit: Payer: Self-pay

## 2019-10-16 ENCOUNTER — Ambulatory Visit: Payer: BC Managed Care – PPO | Admitting: Family Medicine

## 2019-10-16 VITALS — BP 120/76 | HR 92 | Temp 97.9°F | Wt 245.8 lb

## 2019-10-16 DIAGNOSIS — E119 Type 2 diabetes mellitus without complications: Secondary | ICD-10-CM

## 2019-10-16 DIAGNOSIS — R350 Frequency of micturition: Secondary | ICD-10-CM | POA: Diagnosis not present

## 2019-10-16 DIAGNOSIS — J309 Allergic rhinitis, unspecified: Secondary | ICD-10-CM

## 2019-10-16 LAB — POCT URINALYSIS DIP (PROADVANTAGE DEVICE)
Bilirubin, UA: NEGATIVE
Blood, UA: NEGATIVE
Glucose, UA: 500 mg/dL — AB
Ketones, POC UA: NEGATIVE mg/dL
Leukocytes, UA: NEGATIVE
Nitrite, UA: NEGATIVE
Protein Ur, POC: NEGATIVE mg/dL
Specific Gravity, Urine: 1.015
Urobilinogen, Ur: 0.2
pH, UA: 6 (ref 5.0–8.0)

## 2019-10-16 NOTE — Progress Notes (Signed)
   Subjective:    Patient ID: Marco Campbell, male    DOB: 12-Dec-1965, 54 y.o.   MRN: [id number]  HPI He is here for consult concerning his diabetes.  His last hemoglobin A1c was 6.3.  Recently his wife saw him drinking regular Coke which got her quite concerned and they are here to discuss that.  He apparently has also had difficulty with glucometers at work properly.  He also complains of 1 week history of headache, postnasal drainage, dry cough with some nasal congestion but no fever, chills, sore throat or earache.  He did complain of some urinary frequency.  He does have underlying allergies has tried Claritin with not much success. He finally started taking his [place]  Review of Systems     Objective:   Physical Exam Alert and in no distress. Tympanic membranes and canals are normal. Pharyngeal area is normal. Neck is supple without adenopathy or thyromegaly. Cardiac exam shows a regular sinus rhythm without murmurs or gallops. Lungs are clear to auscultation. CBG today is 76       Assessment & Plan:  Controlled type 2 diabetes mellitus without complication, without long-term current use of insulin ([facility]) - Plan: POCT Urinalysis DIP (Proadvantage Device)  Frequency of urination - Plan: POCT Urinalysis DIP (Proadvantage Device)  Allergic rhinitis, unspecified seasonality, unspecified trigger He was given a new glucometer given instructions on how to use it properly.  I then discussed diet and exercise with him especially empty calories such as soft drinks, sweet tea, alcohol.  Explained that he can eat and drink anything he wants but it must be taken into consideration as to the total number of calories. Recommend he treat the allergies with Allegra as well as Tylenol.  Call if further trouble.

## 2019-11-02 ENCOUNTER — Other Ambulatory Visit: Payer: Self-pay | Admitting: Family Medicine

## 2019-11-02 DIAGNOSIS — I10 Essential (primary) hypertension: Secondary | ICD-10-CM

## 2019-11-13 ENCOUNTER — Other Ambulatory Visit: Payer: Self-pay | Admitting: Family Medicine

## 2019-11-13 DIAGNOSIS — E119 Type 2 diabetes mellitus without complications: Secondary | ICD-10-CM

## 2019-11-20 ENCOUNTER — Telehealth: Payer: Self-pay | Admitting: Family Medicine

## 2019-11-20 MED ORDER — GLUCOSE BLOOD VI STRP: ORAL_STRIP | 12 refills | Status: DC

## 2019-11-20 NOTE — Telephone Encounter (Signed)
Pt called needs test strips for contour meter  [facility]

## 2019-11-20 NOTE — Telephone Encounter (Signed)
Sent!

## 2019-11-23 ENCOUNTER — Other Ambulatory Visit: Payer: Self-pay | Admitting: Family Medicine

## 2020-02-04 ENCOUNTER — Other Ambulatory Visit: Payer: Self-pay | Admitting: Family Medicine

## 2020-02-04 DIAGNOSIS — I10 Essential (primary) hypertension: Secondary | ICD-10-CM

## 2020-02-04 NOTE — Telephone Encounter (Signed)
Patient requesting a refill on his Lisinopril-HCTZ 20-12.5 MG. Last office visit was on 10/16/2019 and upcoming visit is on 03/16/2020. Medication send into pharmacy.

## 2020-02-15 ENCOUNTER — Other Ambulatory Visit: Payer: Self-pay | Admitting: Family Medicine

## 2020-03-10 ENCOUNTER — Other Ambulatory Visit: Payer: Self-pay

## 2020-03-10 ENCOUNTER — Encounter: Payer: Self-pay | Admitting: Family Medicine

## 2020-03-10 ENCOUNTER — Ambulatory Visit: Payer: BC Managed Care – PPO | Admitting: Family Medicine

## 2020-03-10 VITALS — BP 120/70 | HR 81 | Wt 246.0 lb

## 2020-03-10 DIAGNOSIS — Z716 Tobacco abuse counseling: Secondary | ICD-10-CM | POA: Diagnosis not present

## 2020-03-10 NOTE — Progress Notes (Signed)
   Subjective:    Patient ID: Marco Campbell, male    DOB: November 16, 1965, 54 y.o.   MRN: [id number]  HPI Chief Complaint  Patient presents with  . smoking cessation    discuss smoking cessation. has chantix but never started it   He is here to discuss stopping smoking.  States he does not yet have a stop date. States his motivation is because of his insurance premium increasing unless he stop smoking.  States he has been smoking since age 53. Stopped in 2007 for about year. He used Chantix then.   Currently smoking 1/2 to 1 pack per day.  States he has Chantix at home but has not started it.  Denies fever, chills, dizziness, chest pain, palpitations, shortness of breath, abdominal pain, N/V/D, urinary symptoms, LE edema.   Reviewed allergies, medications, past medical, surgical, family, and social history.   Review of Systems Pertinent positives and negatives in the history of present illness.     Objective:   Physical Exam BP 120/70   Pulse 81   Wt 246 lb (111.6 kg)   BMI 32.46 kg/m   Alert and in no distress.  Cardiac exam shows a regular sinus rhythm without murmurs or gallops. Lungs are clear to auscultation.  Respirations unlabored.  Extremities without edema.  Skin is warm and dry.       Assessment & Plan:  Encounter for smoking cessation counseling  Congratulated him on taking the next step towards smoking cessation.  Discussed that people who are most successful stopping smoking and staying stopped are the ones who are also involved in counseling.  Discussed that he most likely has an addiction as well as smoking being a habit for him.  He has only tried Chantix in the past in 2007.  This did work for him for approximately 1 year.  Discussed that Chantix is no longer an option and has been recalled.  He will discard the medication he has at home. States he was evaluated by his cardiologist and given a good report.  He denies any other new concerns or health issues  today. Discussed options including NicoDerm or Wellbutrin. Encouraged him to make a list of the times that he smokes (when he is stressed, bored, driving, etc, and then make another list of healthy substitutions that he could do instead of smoking.  Discussed the importance of committing to a stop date. He has an appointment next week with me for diabetes follow-up.  I recommend he think about treatment that might work best for him and that we initiate this when he returns next week if he is ready.

## 2020-03-10 NOTE — Patient Instructions (Signed)
Coping with Quitting Smoking  Quitting smoking is a physical and mental challenge. You will face cravings, withdrawal symptoms, and temptation. Before quitting, work with your health care provider to make a plan that can help you cope. Preparation can help you quit and keep you from giving in. How can I cope with cravings? Cravings usually last for 5-10 minutes. If you get through it, the craving will pass. Consider taking the following actions to help you cope with cravings:  Keep your mouth busy: ? Chew sugar-free gum. ? Suck on hard candies or a straw. ? Brush your teeth.  Keep your hands and body busy: ? Immediately change to a different activity when you feel a craving. ? Squeeze or play with a ball. ? Do an activity or a hobby, like making bead jewelry, practicing needlepoint, or working with wood. ? Mix up your normal routine. ? Take a short exercise break. Go for a quick walk or run up and down stairs. ? Spend time in public places where smoking is not allowed.  Focus on doing something kind or helpful for someone else.  Call a friend or family member to talk during a craving.  Join a support group.  Call a quit line, such as 1-800-QUIT-NOW.  Talk with your health care provider about medicines that might help you cope with cravings and make quitting easier for you. How can I deal with withdrawal symptoms? Your body may experience negative effects as it tries to get used to not having nicotine in the system. These effects are called withdrawal symptoms. They may include:  Feeling hungrier than normal.  Trouble concentrating.  Irritability.  Trouble sleeping.  Feeling depressed.  Restlessness and agitation.  Craving a cigarette. To manage withdrawal symptoms:  Avoid places, people, and activities that trigger your cravings.  Remember why you want to quit.  Get plenty of sleep.  Avoid coffee and other caffeinated drinks. These may worsen some of your  symptoms. How can I handle social situations? Social situations can be difficult when you are quitting smoking, especially in the first few weeks. To manage this, you can:  Avoid parties, bars, and other social situations where people might be smoking.  Avoid alcohol.  Leave right away if you have the urge to smoke.  Explain to your family and friends that you are quitting smoking. Ask for understanding and support.  Plan activities with friends or family where smoking is not an option. What are some ways I can cope with stress? Wanting to smoke may cause stress, and stress can make you want to smoke. Find ways to manage your stress. Relaxation techniques can help. For example:  Breathe slowly and deeply, in through your nose and out through your mouth.  Listen to soothing, relaxing music.  Talk with a family member or friend about your stress.  Light a candle.  Soak in a bath or take a shower.  Think about a peaceful place. What are some ways I can prevent weight gain? Be aware that many people gain weight after they quit smoking. However, not everyone does. To keep from gaining weight, have a plan in place before you quit and stick to the plan after you quit. Your plan should include:  Having healthy snacks. When you have a craving, it may help to: ? Eat plain popcorn, crunchy carrots, celery, or other cut vegetables. ? Chew sugar-free gum.  Changing how you eat: ? Eat small portion sizes at meals. ? Eat 4-6 small meals   throughout the day instead of 1-2 large meals a day. ? Be mindful when you eat. Do not watch television or do other things that might distract you as you eat.  Exercising regularly: ? Make time to exercise each day. If you do not have time for a long workout, do short bouts of exercise for 5-10 minutes several times a day. ? Do some form of strengthening exercise, like weight lifting, and some form of aerobic exercise, like running or swimming.  Drinking  plenty of water or other low-calorie or no-calorie drinks. Drink 6-8 glasses of water daily, or as much as instructed by your health care provider. Summary  Quitting smoking is a physical and mental challenge. You will face cravings, withdrawal symptoms, and temptation to smoke again. Preparation can help you as you go through these challenges.  You can cope with cravings by keeping your mouth busy (such as by chewing gum), keeping your body and hands busy, and making calls to family, friends, or a helpline for people who want to quit smoking.  You can cope with withdrawal symptoms by avoiding places where people smoke, avoiding drinks with caffeine, and getting plenty of rest.  Ask your health care provider about the different ways to prevent weight gain, avoid stress, and handle social situations. This information is not intended to replace advice given to you by your health care provider. Make sure you discuss any questions you have with your health care provider. Document Revised: 03/22/2017 Document Reviewed: 04/06/2016 Elsevier Patient Education  2020 [employer]vier Inc.  

## 2020-03-13 ENCOUNTER — Other Ambulatory Visit: Payer: Self-pay | Admitting: Family Medicine

## 2020-03-13 DIAGNOSIS — E119 Type 2 diabetes mellitus without complications: Secondary | ICD-10-CM

## 2020-03-14 NOTE — Telephone Encounter (Signed)
Pt has an appt this week 

## 2020-03-15 NOTE — Progress Notes (Signed)
Subjective:    Patient ID: Marco Campbell, male    DOB: Jan 19, 1966, 54 y.o.   MRN: [id number]  Marco Campbell is a 54 y.o. male who presents for follow-up of Type 2 diabetes mellitus and other chronic health conditions.   He is in the contemplation stage of stopping smoking.  States he plans to stop sometime in January.  He is interested in trying Wellbutrin and will let me know 2 to 3 weeks before he is actually ready to stop.  Discussed potential contraindications and he does not appear to have any.  We also discussed how to take medication and potential side effects.  GI intolerance with Metformin   Patient is checking home blood sugars.   Home blood sugar records: average 103-105 How often is blood sugars being checked: every couple days Current symptoms include: none. Patient denies increased appetite, nausea, polyuria, visual disturbances, vomiting and weight loss.  Patient is checking their feet daily. Any Foot concerns (callous, ulcer, wound, thickened nails, toenail fungus, skin fungus, hammer toe): none Last dilated eye exam: within the last year. Dr Katy Fitch   Current treatments: doing well on meds. Medication compliance: good  Current diet: in general, a "healthy" diet   Current exercise: walking Known diabetic complications: none  The following portions of the patient's history were reviewed and updated as appropriate: allergies, current medications, past medical history, past social history and problem list.  ROS as in subjective above.     Objective:    Physical Exam Alert and in no distress otherwise not examined.  Blood pressure 120/70, pulse 80, weight 245 lb 12.8 oz (111.5 kg).  Lab Review Diabetic Labs Latest Ref Rng & Units 03/16/2020 09/18/2019 09/15/2019 05/06/2019 01/06/2019  HbA1c 4.0 - 5.6 % 5.9(A) - 6.3(A) 6.6(A) 6.1(A)  Microalbumin Not estab mg/dL - - - - -  Micro/Creat Ratio 0 - 29 mg/g creat - 4 - - -  Chol 100 - 199 mg/dL - 169 - - -  HDL >39 mg/dL  - 54 - - -  Calc LDL 0 - 99 mg/dL - 96 - - -  Triglycerides 0 - 149 mg/dL - 104 - - -  Creatinine 0.76 - 1.27 mg/dL - 1.10 - 1.22 -   BP/Weight 03/16/2020 03/10/2020 10/16/2019 3/76/2831 08/22/7614  Systolic BP 073 710 626 948 546  Diastolic BP 70 70 76 82 82  Wt. (Lbs) 245.8 246 245.8 246.8 257.6  BMI 32.43 32.46 32.43 32.56 33.99   Foot/eye exam completion dates Latest Ref Rng & Units 09/15/2019 01/08/2019  Eye Exam No Retinopathy - No Retinopathy  Foot Form Completion - Done -    Marco Campbell  reports that he has been smoking cigarettes. He has smoked for the past 30.00 years. He has never used smokeless tobacco. He reports current alcohol use. He reports that he does not use drugs.     Assessment & Plan:    Controlled type 2 diabetes mellitus without complication, without long-term current use of insulin ([place]) - Plan: HgB A1c  Hypertension associated with diabetes ([place])  Hyperlipidemia associated with type 2 diabetes mellitus ([place])  Smoker  1. Rx changes: none hemoglobin A1c 5.9% and improved.  Doing well on Farxiga. 2. Education: Reviewed 'ABCs' of diabetes management (respective goals in parentheses):  A1C (<7), blood pressure (<130/80), and cholesterol (LDL <100). 3. Compliance at present is estimated to be good. Efforts to improve compliance (if necessary) will be directed at dietary modifications: Continue on low sugar, low carbohydrate diet  and increased exercise.  He will also continue checking his blood sugars at home. 4. Diabetic eye exam up-to-date and I requested this from Dr. Zenia Resides office. 5. He declines any vaccines today due to tomorrow being Thanksgiving and he is traveling. 6. Blood pressure well controlled.  Continue current medication. 7. Continue statin therapy.  He is not fasting so we did not recheck his lipids today. 8. Smoking cessation discussed including nicotine replacement therapy versus Wellbutrin.  He prefers to try Wellbutrin and will let me know a  couple of weeks before his stop date once he sets it.  We have discussed behavior modification therapy in addition to the medication.  He has been provided with a handout with tips to stop smoking including 1 800 quit now. 9. Follow up: 6 months

## 2020-03-16 ENCOUNTER — Encounter: Payer: Self-pay | Admitting: Family Medicine

## 2020-03-16 ENCOUNTER — Ambulatory Visit (INDEPENDENT_AMBULATORY_CARE_PROVIDER_SITE_OTHER): Payer: BC Managed Care – PPO | Admitting: Family Medicine

## 2020-03-16 ENCOUNTER — Other Ambulatory Visit: Payer: Self-pay

## 2020-03-16 VITALS — BP 120/70 | HR 80 | Wt 245.8 lb

## 2020-03-16 DIAGNOSIS — E119 Type 2 diabetes mellitus without complications: Secondary | ICD-10-CM | POA: Diagnosis not present

## 2020-03-16 DIAGNOSIS — E1169 Type 2 diabetes mellitus with other specified complication: Secondary | ICD-10-CM

## 2020-03-16 DIAGNOSIS — I152 Hypertension secondary to endocrine disorders: Secondary | ICD-10-CM

## 2020-03-16 DIAGNOSIS — E785 Hyperlipidemia, unspecified: Secondary | ICD-10-CM

## 2020-03-16 DIAGNOSIS — F17200 Nicotine dependence, unspecified, uncomplicated: Secondary | ICD-10-CM

## 2020-03-16 DIAGNOSIS — E1159 Type 2 diabetes mellitus with other circulatory complications: Secondary | ICD-10-CM | POA: Diagnosis not present

## 2020-03-16 LAB — POCT GLYCOSYLATED HEMOGLOBIN (HGB A1C): Hemoglobin A1C: 5.9 % — AB (ref 4.0–5.6)

## 2020-03-21 ENCOUNTER — Other Ambulatory Visit: Payer: Self-pay | Admitting: Family Medicine

## 2020-03-21 DIAGNOSIS — E119 Type 2 diabetes mellitus without complications: Secondary | ICD-10-CM

## 2020-04-30 ENCOUNTER — Other Ambulatory Visit: Payer: Self-pay | Admitting: Family Medicine

## 2020-04-30 DIAGNOSIS — I10 Essential (primary) hypertension: Secondary | ICD-10-CM

## 2020-05-11 ENCOUNTER — Other Ambulatory Visit: Payer: Self-pay | Admitting: Family Medicine

## 2020-07-08 ENCOUNTER — Encounter: Payer: Self-pay | Admitting: Family Medicine

## 2020-07-08 ENCOUNTER — Ambulatory Visit (INDEPENDENT_AMBULATORY_CARE_PROVIDER_SITE_OTHER): Payer: BC Managed Care – PPO | Admitting: Family Medicine

## 2020-07-08 VITALS — BP 130/80 | HR 92 | Temp 98.5°F | Wt 253.0 lb

## 2020-07-08 DIAGNOSIS — R0981 Nasal congestion: Secondary | ICD-10-CM

## 2020-07-08 DIAGNOSIS — J309 Allergic rhinitis, unspecified: Secondary | ICD-10-CM | POA: Diagnosis not present

## 2020-07-08 DIAGNOSIS — E1159 Type 2 diabetes mellitus with other circulatory complications: Secondary | ICD-10-CM

## 2020-07-08 DIAGNOSIS — I152 Hypertension secondary to endocrine disorders: Secondary | ICD-10-CM

## 2020-07-08 DIAGNOSIS — J3489 Other specified disorders of nose and nasal sinuses: Secondary | ICD-10-CM

## 2020-07-08 DIAGNOSIS — E119 Type 2 diabetes mellitus without complications: Secondary | ICD-10-CM

## 2020-07-08 DIAGNOSIS — M25512 Pain in left shoulder: Secondary | ICD-10-CM

## 2020-07-08 DIAGNOSIS — R5383 Other fatigue: Secondary | ICD-10-CM

## 2020-07-08 NOTE — Patient Instructions (Signed)
Try taking Xyzal for allergies. You may want to try Flonase nasal spray as well.   Go to [facility] imaging for your shoulder XR.  Continue using a topical pain medication and tylenol or ibuprofen as needed.

## 2020-07-08 NOTE — Progress Notes (Signed)
   Subjective:    Patient ID: Marco Campbell, male    DOB: Aug 10, 1965, 55 y.o.   MRN: [id number]  HPI Chief Complaint  Patient presents with  . feeling lousy    Feeling blah, tired, lousy- been going on a couple weeks. Feels like it might be related to farixga   Complains of fatigue, increased stress and not sleeping as well as usual.  He is working longer hours and muti-tasking currently at work.   Reports having clear nasal drainage and nasal congestion.   Left shoulder pain in certain positions. NKI. States he has pain in his left shoulder when sleeping.  No numbness, tingling or weakness. No other arthralgias or myalgias.   Started a probiotic one week ago for GI issues.   DM- BS are good at home. He is taking [place] still and doing fine. He has been on this medication for years and no problems.   Reports taking BP medication and no issues.   Denies fever, chills, dizziness, chest pain, palpitations, shortness of breath, abdominal pain, N/V urinary symptoms, LE edema.   Reviewed allergies, medications, past medical, surgical, family, and social history.    Review of Systems Pertinent positives and negatives in the history of present illness.     Objective:   Physical Exam BP 130/80   Pulse 92   Temp 98.5 F (36.9 C)   Wt 253 lb (114.8 kg)   BMI 33.38 kg/m   Alert and in no distress. No sinus tenderness.  Tympanic membranes and canals are normal. Pharyngeal area is normal. Neck is supple without adenopathy or thyromegaly. Cardiac exam shows a regular sinus rhythm without murmurs or gallops. Lungs are clear to auscultation. Extremities without edema. Left shoulder with TTP to lateral aspect of AC joint. Normal ROM and strength. Crepitus with motion. Skin is warm and dry.        Assessment & Plan:  Fatigue, unspecified type - Plan: CBC with Differential/Platelet, Comprehensive metabolic panel, Novel Coronavirus, NAA ([facility]), TSH, T4, free, VITAMIN D 25 Hydroxy  (Vit-D Deficiency, Fractures), Vitamin B12 -Discussed multiple etiologies for fatigue.  No red flag symptoms.  Suspect interrupted sleep may be contributing.  Not sleeping well due to increased stress at work. He has OSA and is not using CPAP. Check labs and follow-up  Nasal congestion with rhinorrhea - Plan: Novel Coronavirus, NAA ([facility]), TSH -Screen for Covid and treat with antihistamine and nasal spray  Allergic rhinitis, unspecified seasonality, unspecified trigger -Antihistamine and nasal spray  Controlled type 2 diabetes mellitus without complication, without long-term current use of insulin ([place]) - Plan: CBC with Differential/Platelet, Comprehensive metabolic panel, Novel Coronavirus, NAA ([facility]), TSH, Hemoglobin A1c -Continue Farxiga.  Recommend low sugar and low-carb diet.  Discussed avoiding sugary beverages.  Follow-up pending A1c  Hypertension associated with diabetes ([place]) - Plan: CBC with Differential/Platelet, Comprehensive metabolic panel -Blood pressure control.  Continue medication  Acute pain of left shoulder - Plan: DG Shoulder Left -X-ray ordered per patient request.  Discussed conservative treatment

## 2020-07-09 LAB — CBC WITH DIFFERENTIAL/PLATELET
Basophils Absolute: 0 10*3/uL (ref 0.0–0.2)
Basos: 0 %
EOS (ABSOLUTE): 0.2 10*3/uL (ref 0.0–0.4)
Eos: 1 %
Hematocrit: 47.4 % (ref 37.5–51.0)
Hemoglobin: 15.8 g/dL (ref 13.0–17.7)
Immature Grans (Abs): 0 10*3/uL (ref 0.0–0.1)
Immature Granulocytes: 0 %
Lymphocytes Absolute: 2.7 10*3/uL (ref 0.7–3.1)
Lymphs: 26 %
MCH: 28.4 pg (ref 26.6–33.0)
MCHC: 33.3 g/dL (ref 31.5–35.7)
MCV: 85 fL (ref 79–97)
Monocytes Absolute: 1.1 10*3/uL — ABNORMAL HIGH (ref 0.1–0.9)
Monocytes: 11 %
Neutrophils Absolute: 6.5 10*3/uL (ref 1.4–7.0)
Neutrophils: 62 %
Platelets: 290 10*3/uL (ref 150–450)
RBC: 5.57 x10E6/uL (ref 4.14–5.80)
RDW: 12.9 % (ref 11.6–15.4)
WBC: 10.6 10*3/uL (ref 3.4–10.8)

## 2020-07-09 LAB — HEMOGLOBIN A1C
Est. average glucose Bld gHb Est-mCnc: 128 mg/dL
Hgb A1c MFr Bld: 6.1 % — ABNORMAL HIGH (ref 4.8–5.6)

## 2020-07-09 LAB — COMPREHENSIVE METABOLIC PANEL
ALT: 19 IU/L (ref 0–44)
AST: 21 IU/L (ref 0–40)
Albumin/Globulin Ratio: 1.4 (ref 1.2–2.2)
Albumin: 4.3 g/dL (ref 3.8–4.9)
Alkaline Phosphatase: 71 IU/L (ref 44–121)
BUN/Creatinine Ratio: 14 (ref 9–20)
BUN: 20 mg/dL (ref 6–24)
Bilirubin Total: 0.3 mg/dL (ref 0.0–1.2)
CO2: 19 mmol/L — ABNORMAL LOW (ref 20–29)
Calcium: 9.4 mg/dL (ref 8.7–10.2)
Chloride: 103 mmol/L (ref 96–106)
Creatinine, Ser: 1.38 mg/dL — ABNORMAL HIGH (ref 0.76–1.27)
Globulin, Total: 3 g/dL (ref 1.5–4.5)
Glucose: 117 mg/dL — ABNORMAL HIGH (ref 65–99)
Potassium: 4 mmol/L (ref 3.5–5.2)
Sodium: 140 mmol/L (ref 134–144)
Total Protein: 7.3 g/dL (ref 6.0–8.5)
eGFR: 61 mL/min/{1.73_m2} (ref >59)

## 2020-07-09 LAB — VITAMIN B12: Vitamin B-12: 491 pg/mL (ref 232–1245)

## 2020-07-09 LAB — TSH: TSH: 0.646 u[IU]/mL (ref 0.450–4.500)

## 2020-07-09 LAB — VITAMIN D 25 HYDROXY (VIT D DEFICIENCY, FRACTURES): Vit D, 25-Hydroxy: 10 ng/mL — ABNORMAL LOW (ref 30.0–100.0)

## 2020-07-09 LAB — T4, FREE: Free T4: 1.36 ng/dL (ref 0.82–1.77)

## 2020-07-09 LAB — NOVEL CORONAVIRUS, NAA: SARS-CoV-2, NAA: NOT DETECTED

## 2020-07-09 LAB — SARS-COV-2, NAA 2 DAY TAT

## 2020-07-10 ENCOUNTER — Other Ambulatory Visit: Payer: Self-pay | Admitting: Family Medicine

## 2020-07-10 DIAGNOSIS — E559 Vitamin D deficiency, unspecified: Secondary | ICD-10-CM

## 2020-07-10 DIAGNOSIS — E119 Type 2 diabetes mellitus without complications: Secondary | ICD-10-CM

## 2020-07-10 MED ORDER — VITAMIN D (ERGOCALCIFEROL) 1.25 MG (50000 UNIT) PO CAPS: 50000.0000 [IU] | ORAL_CAPSULE | ORAL | 0 refills | Status: DC

## 2020-07-10 NOTE — Progress Notes (Signed)
He is negative for Covid as suspected.  His vitamin D level is severely low. I will send in a prescription to take once weekly. When the prescription is finished, start taking vitamin D3 2,0000 IUs daily.  His blood sugars are good with a Hgb A1c 6.1%  His kidney function is mildly elevated and this is often seen in patient's who were not well hydrated at the time of his visit. Make sure he is staying hydrated.  Otherwise, labs are fine.  If he continues having fatigue, we can look for other causes. Sleep apnea may be contributing to his fatigue.

## 2020-08-03 ENCOUNTER — Other Ambulatory Visit: Payer: Self-pay | Admitting: Family Medicine

## 2020-08-03 DIAGNOSIS — I10 Essential (primary) hypertension: Secondary | ICD-10-CM

## 2020-08-06 ENCOUNTER — Other Ambulatory Visit: Payer: Self-pay | Admitting: Family Medicine

## 2020-08-19 ENCOUNTER — Encounter: Payer: Self-pay | Admitting: Internal Medicine

## 2020-09-14 ENCOUNTER — Telehealth: Payer: Self-pay | Admitting: Internal Medicine

## 2020-09-14 NOTE — Telephone Encounter (Signed)
Left message for pt to call back to let me know if he has had eye exam in the last year so we can request this

## 2020-09-26 ENCOUNTER — Encounter: Payer: BC Managed Care – PPO | Admitting: Family Medicine

## 2020-10-12 ENCOUNTER — Other Ambulatory Visit: Payer: Self-pay | Admitting: Family Medicine

## 2020-10-12 DIAGNOSIS — E119 Type 2 diabetes mellitus without complications: Secondary | ICD-10-CM

## 2020-10-26 ENCOUNTER — Encounter: Payer: BC Managed Care – PPO | Admitting: Family Medicine

## 2020-10-27 ENCOUNTER — Other Ambulatory Visit: Payer: Self-pay | Admitting: Family Medicine

## 2020-10-27 DIAGNOSIS — I10 Essential (primary) hypertension: Secondary | ICD-10-CM

## 2020-11-12 ENCOUNTER — Other Ambulatory Visit: Payer: Self-pay | Admitting: Family Medicine

## 2020-11-19 ENCOUNTER — Other Ambulatory Visit: Payer: Self-pay | Admitting: Family Medicine

## 2020-11-19 DIAGNOSIS — E119 Type 2 diabetes mellitus without complications: Secondary | ICD-10-CM

## 2020-12-12 ENCOUNTER — Telehealth: Payer: Self-pay | Admitting: Family Medicine

## 2020-12-12 NOTE — Telephone Encounter (Signed)
Left message advising pt of vickie's return and to call and reschedule cpe

## 2020-12-24 ENCOUNTER — Other Ambulatory Visit: Payer: Self-pay | Admitting: Family Medicine

## 2020-12-24 DIAGNOSIS — E119 Type 2 diabetes mellitus without complications: Secondary | ICD-10-CM

## 2021-01-23 ENCOUNTER — Other Ambulatory Visit: Payer: Self-pay | Admitting: Family Medicine

## 2021-01-23 DIAGNOSIS — E119 Type 2 diabetes mellitus without complications: Secondary | ICD-10-CM

## 2021-01-25 ENCOUNTER — Other Ambulatory Visit: Payer: Self-pay

## 2021-01-25 ENCOUNTER — Telehealth: Payer: Self-pay

## 2021-01-25 DIAGNOSIS — E119 Type 2 diabetes mellitus without complications: Secondary | ICD-10-CM

## 2021-01-25 DIAGNOSIS — I10 Essential (primary) hypertension: Secondary | ICD-10-CM

## 2021-01-25 MED ORDER — LISINOPRIL-HYDROCHLOROTHIAZIDE 20-12.5 MG PO TABS
1.0000 | ORAL_TABLET | Freq: Every day | ORAL | 0 refills | Status: DC
Start: 2021-01-25 — End: 2021-04-13

## 2021-01-25 MED ORDER — DAPAGLIFLOZIN PROPANEDIOL 5 MG PO TABS: ORAL_TABLET | ORAL | 0 refills | Status: DC

## 2021-01-25 NOTE — Telephone Encounter (Signed)
Pt. Called wanted to reschedule his CPE looks like his had to be canceled due to you being out. I told him you were leaving and he would have to call back in about 3-4 months to schedule a CPE with the new provider. He stated he does need a refill on his Farxiga and Lisinopril to [facility].

## 2021-03-13 ENCOUNTER — Other Ambulatory Visit: Payer: Self-pay

## 2021-03-13 MED ORDER — ATORVASTATIN CALCIUM 20 MG PO TABS: 20.0000 mg | ORAL_TABLET | Freq: Every day | ORAL | 0 refills | Status: DC

## 2021-04-13 ENCOUNTER — Other Ambulatory Visit: Payer: Self-pay

## 2021-04-13 ENCOUNTER — Other Ambulatory Visit: Payer: Self-pay | Admitting: Family Medicine

## 2021-04-13 DIAGNOSIS — I10 Essential (primary) hypertension: Secondary | ICD-10-CM

## 2021-04-13 DIAGNOSIS — E119 Type 2 diabetes mellitus without complications: Secondary | ICD-10-CM

## 2021-04-13 MED ORDER — DAPAGLIFLOZIN PROPANEDIOL 5 MG PO TABS: ORAL_TABLET | ORAL | 0 refills | Status: DC

## 2021-04-13 MED ORDER — LISINOPRIL-HYDROCHLOROTHIAZIDE 20-12.5 MG PO TABS: 1.0000 | ORAL_TABLET | Freq: Every day | ORAL | 0 refills | Status: DC

## 2021-06-08 ENCOUNTER — Ambulatory Visit: Payer: BC Managed Care – PPO | Admitting: Physician Assistant

## 2021-06-12 ENCOUNTER — Encounter: Payer: Self-pay | Admitting: Physician Assistant

## 2021-06-12 ENCOUNTER — Ambulatory Visit: Payer: BC Managed Care – PPO | Admitting: Physician Assistant

## 2021-06-12 ENCOUNTER — Other Ambulatory Visit: Payer: Self-pay

## 2021-06-12 VITALS — BP 138/80 | HR 80 | Resp 20 | Ht 73.0 in | Wt 251.4 lb

## 2021-06-12 DIAGNOSIS — E119 Type 2 diabetes mellitus without complications: Secondary | ICD-10-CM

## 2021-06-12 DIAGNOSIS — E1169 Type 2 diabetes mellitus with other specified complication: Secondary | ICD-10-CM | POA: Diagnosis not present

## 2021-06-12 DIAGNOSIS — E1159 Type 2 diabetes mellitus with other circulatory complications: Secondary | ICD-10-CM | POA: Diagnosis not present

## 2021-06-12 DIAGNOSIS — R7989 Other specified abnormal findings of blood chemistry: Secondary | ICD-10-CM | POA: Diagnosis not present

## 2021-06-12 DIAGNOSIS — E785 Hyperlipidemia, unspecified: Secondary | ICD-10-CM

## 2021-06-12 DIAGNOSIS — I152 Hypertension secondary to endocrine disorders: Secondary | ICD-10-CM

## 2021-06-12 DIAGNOSIS — E559 Vitamin D deficiency, unspecified: Secondary | ICD-10-CM

## 2021-06-12 MED ORDER — LISINOPRIL-HYDROCHLOROTHIAZIDE 20-12.5 MG PO TABS: 1.0000 | ORAL_TABLET | Freq: Every day | ORAL | 3 refills | Status: DC

## 2021-06-12 MED ORDER — DAPAGLIFLOZIN PROPANEDIOL 5 MG PO TABS: ORAL_TABLET | ORAL | 3 refills | Status: DC

## 2021-06-12 MED ORDER — ATORVASTATIN CALCIUM 20 MG PO TABS: 20.0000 mg | ORAL_TABLET | Freq: Every day | ORAL | 3 refills | Status: DC

## 2021-06-12 NOTE — Progress Notes (Signed)
Acute Office Visit  Subjective:    Patient ID: Marco Campbell, male    DOB: 1966-03-21, 56 y.o.   MRN: [id number]  Chief Complaint  Patient presents with   Diabetes    Nonfasting med check, no concerns.     HPI Patient is in today for a follow up appointment. Reports he is compliant with his medicines and denies any new issues.  Past Medical History:  Diagnosis Date   AKI (acute kidney injury) ([place]) 07/09/2016   Allergy    Arthritis    Blurry vision 07/09/2016   Controlled type 2 diabetes mellitus without complication, without long-term current use of insulin ([place]) 04/09/2017   Diabetes mellitus without complication ([facility])    Elevated LDL cholesterol level 03/05/2017   Hyperlipidemia    Hypertension    Sleep apnea    Does not use CPAP   SOB (shortness of breath) 11/07/2017    No past surgical history on file.  Family History  Problem Relation Age of Onset   Diabetes Mother    Heart failure Mother        Died age 68   Diabetes Father    Heart disease Father    Heart attack Father 7       CABG twice.  Died age 32   Hypertension Brother    Diabetes Brother    Heart failure Brother 81   Diabetes Maternal Grandmother    Diabetes Sister    Heart failure Sister 24   Colon cancer Neg Hx    Esophageal cancer Neg Hx    Rectal cancer Neg Hx    Stomach cancer Neg Hx     Social History   Socioeconomic History   Marital status: Divorced    Spouse name: Not on file   Number of children: Not on file   Years of education: Not on file   Highest education level: Not on file  Occupational History   Not on file  Tobacco Use   Smoking status: Every Day    Years: 30.00    Types: Cigarettes   Smokeless tobacco: Never  Substance and Sexual Activity   Alcohol use: Yes    Comment: occasionally   Drug use: No   Sexual activity: Not on file  Other Topics Concern   Not on file  Social History Narrative   Not on file   Social Determinants of Health   Financial  Resource Strain: Not on file  Food Insecurity: Not on file  Transportation Needs: Not on file  Physical Activity: Not on file  Stress: Not on file  Social Connections: Not on file  Intimate Partner Violence: Not on file    Outpatient Medications Prior to Visit  Medication Sig Dispense Refill   BAYER MICROLET LANCETS lancets by Other route. Pt was given this in office 09/05/16 pfm     blood glucose meter kit and supplies Dispense based on patient and insurance preference. Use up to four times daily as directed. (FOR ICD-9 250.00, 250.01). 1 each 0   glucose blood test strip Contour test strips. Please provide strips for meter patient use. Test 1-2 times daily 100 each 12   atorvastatin (LIPITOR) 20 MG tablet TAKE 1 TABLET BY MOUTH EVERY DAY 90 tablet 0   dapagliflozin propanediol (FARXIGA) 5 MG TABS tablet TAKE 1 TABLET BY MOUTH EVERY DAY BEFORE BREAKFAST 90 tablet 0   lisinopril-hydrochlorothiazide (ZESTORETIC) 20-12.5 MG tablet Take 1 tablet by mouth daily. 90 tablet 0   EPINEPHrine  0.3 mg/0.3 mL IJ SOAJ injection Inject 0.3 mLs (0.3 mg total) into the muscle as needed for anaphylaxis. (Patient not taking: Reported on 06/12/2021) 1 Device 0   ibuprofen (ADVIL,MOTRIN) 600 MG tablet Take 1 tablet (600 mg total) by mouth every 6 (six) hours as needed. (Patient not taking: Reported on 06/12/2021) 30 tablet 0   Vitamin D, Ergocalciferol, (DRISDOL) 1.25 MG (50000 UNIT) CAPS capsule Take 1 capsule (50,000 Units total) by mouth every 7 (seven) days. 12 capsule 0   No facility-administered medications prior to visit.    Allergies  Allergen Reactions   Bee Venom Hives   Metformin And Related Diarrhea    Tried XR as well     Review of Systems  Constitutional:  Negative for activity change, chills, fatigue and fever.  HENT:  Negative for congestion, ear pain, hearing loss and voice change.   Eyes:  Negative for pain and redness.  Respiratory:  Negative for cough and shortness of breath.    Cardiovascular:  Negative for leg swelling.  Gastrointestinal:  Negative for constipation, diarrhea, nausea and vomiting.  Endocrine: Negative for polyuria.  Genitourinary:  Negative for flank pain and frequency.  Musculoskeletal:  Negative for joint swelling and neck pain.  Skin:  Negative for rash.  Neurological:  Negative for dizziness.  Hematological:  Does not bruise/bleed easily.  Psychiatric/Behavioral:  Negative for agitation and behavioral problems.       Objective:    Physical Exam Vitals and nursing note reviewed.  Constitutional:      General: He is not in acute distress.    Appearance: Normal appearance.  HENT:     Head: Normocephalic and atraumatic.     Right Ear: External ear normal.     Left Ear: External ear normal.     Nose: No congestion.  Eyes:     Extraocular Movements: Extraocular movements intact.     Conjunctiva/sclera: Conjunctivae normal.     Pupils: Pupils are equal, round, and reactive to light.  Cardiovascular:     Rate and Rhythm: Normal rate and regular rhythm.     Pulses: Normal pulses.     Heart sounds: Normal heart sounds.  Pulmonary:     Effort: Pulmonary effort is normal.     Breath sounds: Normal breath sounds. No wheezing.  Abdominal:     General: Bowel sounds are normal.     Palpations: Abdomen is soft.  Musculoskeletal:        General: Normal range of motion.     Cervical back: Normal range of motion and neck supple.     Right lower leg: No edema.     Left lower leg: No edema.  Skin:    General: Skin is warm and dry.     Findings: No rash.  Neurological:     Mental Status: He is alert and oriented to person, place, and time.     Gait: Gait normal.  Psychiatric:        Mood and Affect: Mood normal.        Behavior: Behavior normal.    BP 138/80    Pulse 80    Resp 20    Ht 6' 1"  (1.854 m)    Wt 251 lb 6.4 oz (114 kg)    BMI 33.17 kg/m   Wt Readings from Last 3 Encounters:  06/12/21 251 lb 6.4 oz (114 kg)  07/08/20 253  lb (114.8 kg)  03/16/20 245 lb 12.8 oz (111.5 kg)    Health Maintenance  Due  Topic Date Due   HEMOGLOBIN A1C  01/08/2021    There are no preventive care reminders to display for this patient.   Lab Results  Component Value Date   TSH 0.646 07/08/2020   Lab Results  Component Value Date   WBC 10.6 07/08/2020   HGB 15.8 07/08/2020   HCT 47.4 07/08/2020   MCV 85 07/08/2020   PLT 290 07/08/2020   Lab Results  Component Value Date   NA 140 07/08/2020   K 4.0 07/08/2020   CO2 19 (L) 07/08/2020   GLUCOSE 117 (H) 07/08/2020   BUN 20 07/08/2020   CREATININE 1.38 (H) 07/08/2020   BILITOT 0.3 07/08/2020   ALKPHOS 71 07/08/2020   AST 21 07/08/2020   ALT 19 07/08/2020   PROT 7.3 07/08/2020   ALBUMIN 4.3 07/08/2020   CALCIUM 9.4 07/08/2020   ANIONGAP 8 07/10/2016   EGFR 61 07/08/2020   Lab Results  Component Value Date   CHOL 169 09/18/2019   Lab Results  Component Value Date   HDL 54 09/18/2019   Lab Results  Component Value Date   LDLCALC 96 09/18/2019   Lab Results  Component Value Date   TRIG 104 09/18/2019   Lab Results  Component Value Date   CHOLHDL 3.1 09/18/2019   Lab Results  Component Value Date   HGBA1C 6.1 (H) 07/08/2020       Assessment & Plan:   Problem List Items Addressed This Visit       Cardiovascular and Mediastinum   Hypertension associated with diabetes ([place])   Relevant Medications   lisinopril-hydrochlorothiazide (ZESTORETIC) 20-12.5 MG tablet   dapagliflozin propanediol (FARXIGA) 5 MG TABS tablet   atorvastatin (LIPITOR) 20 MG tablet   Other Relevant Orders   CBC with Differential/Platelet     Endocrine   Controlled type 2 diabetes mellitus without complication, without long-term current use of insulin ([facility]) - Primary   Relevant Medications   lisinopril-hydrochlorothiazide (ZESTORETIC) 20-12.5 MG tablet   dapagliflozin propanediol (FARXIGA) 5 MG TABS tablet   atorvastatin (LIPITOR) 20 MG tablet   Other Relevant Orders    CBC with Differential/Platelet   Comprehensive metabolic panel   Hemoglobin A1c   Hyperlipidemia associated with type 2 diabetes mellitus ([facility])   Relevant Medications   lisinopril-hydrochlorothiazide (ZESTORETIC) 20-12.5 MG tablet   dapagliflozin propanediol (FARXIGA) 5 MG TABS tablet   atorvastatin (LIPITOR) 20 MG tablet   Other Relevant Orders   CBC with Differential/Platelet   Lipid panel     Other   Elevated serum creatinine   Relevant Orders   CBC with Differential/Platelet   Vitamin D deficiency     Meds ordered this encounter  Medications   lisinopril-hydrochlorothiazide (ZESTORETIC) 20-12.5 MG tablet    Sig: Take 1 tablet by mouth daily.    Dispense:  90 tablet    Refill:  3    Order Specific Question:   Supervising Provider    Answer:   Denita Lung [[id number]]   dapagliflozin propanediol (FARXIGA) 5 MG TABS tablet    Sig: TAKE 1 TABLET BY MOUTH EVERY DAY BEFORE BREAKFAST    Dispense:  90 tablet    Refill:  3    Order Specific Question:   Supervising Provider    Answer:   Denita Lung [[id number]]   atorvastatin (LIPITOR) 20 MG tablet    Sig: Take 1 tablet (20 mg total) by mouth daily.    Dispense:  90 tablet    Refill:  3  Order Specific Question:   Supervising Provider    Answer:   Denita Lung [[id number]]   Type D DM / HLD / HTN  - Stable, continue current management, eat a low sugar, low carbohydrate diet, eat a low fat diet, eat a low salt diet, will check CMP, hgb a1c, lipid panel today  Elevated serum creatinine - will check CMP & CBC today  Vitamin D deficiency - stable, may need to add prescription strength, weekly rx for vit d at next office visit  Return for annual exam in 1 month   Irene Pap, PA-C

## 2021-06-13 LAB — LIPID PANEL
Chol/HDL Ratio: 3.6 ratio (ref 0.0–5.0)
Cholesterol, Total: 175 mg/dL (ref 100–199)
HDL: 48 mg/dL (ref >39)
LDL Chol Calc (NIH): 94 mg/dL (ref 0–99)
Triglycerides: 196 mg/dL — ABNORMAL HIGH (ref 0–149)
VLDL Cholesterol Cal: 33 mg/dL (ref 5–40)

## 2021-06-13 LAB — CBC WITH DIFFERENTIAL/PLATELET
Basophils Absolute: 0 10*3/uL (ref 0.0–0.2)
Basos: 0 %
EOS (ABSOLUTE): 0.2 10*3/uL (ref 0.0–0.4)
Eos: 2 %
Hematocrit: 44.2 % (ref 37.5–51.0)
Hemoglobin: 15.2 g/dL (ref 13.0–17.7)
Immature Grans (Abs): 0 10*3/uL (ref 0.0–0.1)
Immature Granulocytes: 0 %
Lymphocytes Absolute: 2.9 10*3/uL (ref 0.7–3.1)
Lymphs: 28 %
MCH: 29.2 pg (ref 26.6–33.0)
MCHC: 34.4 g/dL (ref 31.5–35.7)
MCV: 85 fL (ref 79–97)
Monocytes Absolute: 1 10*3/uL — ABNORMAL HIGH (ref 0.1–0.9)
Monocytes: 10 %
Neutrophils Absolute: 6 10*3/uL (ref 1.4–7.0)
Neutrophils: 60 %
Platelets: 263 10*3/uL (ref 150–450)
RBC: 5.2 x10E6/uL (ref 4.14–5.80)
RDW: 12.9 % (ref 11.6–15.4)
WBC: 10.2 10*3/uL (ref 3.4–10.8)

## 2021-06-13 LAB — COMPREHENSIVE METABOLIC PANEL
ALT: 19 IU/L (ref 0–44)
AST: 18 IU/L (ref 0–40)
Albumin/Globulin Ratio: 1.4 (ref 1.2–2.2)
Albumin: 4.2 g/dL (ref 3.8–4.9)
Alkaline Phosphatase: 75 IU/L (ref 44–121)
BUN/Creatinine Ratio: 16 (ref 9–20)
BUN: 20 mg/dL (ref 6–24)
Bilirubin Total: <0.2 mg/dL (ref 0.0–1.2)
CO2: 23 mmol/L (ref 20–29)
Calcium: 9.1 mg/dL (ref 8.7–10.2)
Chloride: 104 mmol/L (ref 96–106)
Creatinine, Ser: 1.22 mg/dL (ref 0.76–1.27)
Globulin, Total: 2.9 g/dL (ref 1.5–4.5)
Glucose: 85 mg/dL (ref 70–99)
Potassium: 4.1 mmol/L (ref 3.5–5.2)
Sodium: 140 mmol/L (ref 134–144)
Total Protein: 7.1 g/dL (ref 6.0–8.5)
eGFR: 70 mL/min/{1.73_m2} (ref >59)

## 2021-06-13 LAB — HEMOGLOBIN A1C
Est. average glucose Bld gHb Est-mCnc: 137 mg/dL
Hgb A1c MFr Bld: 6.4 % — ABNORMAL HIGH (ref 4.8–5.6)

## 2021-06-13 NOTE — Progress Notes (Signed)
Please call patient to tell him:  Regarding your recent blood work:  Blood counts are fine, no anemia  Liver and kidneys are fine  Cholesterol level (triglyceride) is a little high; For high cholesterol - Increase fiber intake (Benefiber or Metamucil, Cherrios,  oatmeal, beans, nuts, fruits and vegetables), limit saturated fats (in fried foods, red meat), can add OTC fish oil supplement, eat fish with Omega-3 fatty acids like salmon and tuna, exercise for 30 minutes 3 - 5 times a week, drink 8 - 10 glasses of water a day   Blood sugar levels are fine; continue to eat a low sugar, low carbohydrate (starchy foods)

## 2021-07-19 NOTE — Patient Instructions (Incomplete)
Preventative Care for Adults, Male ?   ?   REGULAR HEALTH EXAMS: ?A routine yearly physical is a good way to check in with your primary care provider about your health and preventive screening. It is also an opportunity to share updates about your health and any concerns you have, and receive a thorough all-over exam.  ?Most health insurance companies pay for at least some preventative services.  Check with your health plan for specific coverages. ? ?WHAT PREVENTATIVE SERVICES DO MEN NEED? ?Adult men should have their weight and blood pressure checked regularly.  ?Men age 35 and older should have their cholesterol levels checked regularly. ?Beginning at age 45 and continuing to age 75, men should be screened for colorectal cancer.  Certain people should may need continued testing until age 85. ?Other cancer screening may include exams for testicular and prostate cancer. ?Updating vaccinations is part of preventative care.  Vaccinations help protect against diseases such as the flu. ?Lab tests are generally done as part of preventative care to screen for anemia and blood disorders, to screen for problems with the kidneys and liver, to screen for bladder problems, to check blood sugar, and to check your cholesterol level. ?Preventative services generally include counseling about diet, exercise, avoiding tobacco, drugs, excessive alcohol consumption, and sexually transmitted infections.   ? ?GENERAL RECOMMENDATIONS FOR GOOD HEALTH: ? ?Healthy diet: ?Eat a variety of foods, including fruit, vegetables, animal or vegetable protein, such as meat, fish, chicken, and eggs, or beans, lentils, tofu, and grains, such as rice. ?Drink plenty of water daily (60 - 80 ounces or 8 - 10 glasses of water a day) ?Decrease saturated fat in the diet, avoid lots of red meat, processed foods, sweets, fast foods, and fried foods. For high cholesterol - Increase fiber intake (Benefiber or Metamucil, Cherrios,  oatmeal, beans, nuts, fruits  and vegetables), limit saturated fats (in fried foods, red meat), can add OTC fish oil supplement, eat fish with Omega-3 fatty acids like salmon and tuna, exercise for 30 minutes 3 - 5 times a week, drink 8 - 10 glasses of water a day. ? ?Exercise: ?Aerobic exercise helps maintain good heart health. At least 30-40 minutes of moderate-intensity exercise is recommended. For example, a brisk walk that increases your heart rate and breathing. This should be done on most days of the week.  ?Find a type of exercise or a variety of exercises that you enjoy so that it becomes a part of your daily life.  Examples are running, walking, swimming, water aerobics, and biking.  For motivation and support, explore group exercise such as aerobic class, spin class, Zumba, Yoga,or  martial arts, etc.   ?Set exercise goals for yourself, such as a certain weight goal, walk or run in a race such as a 5k walk/run.  Speak to your primary care provider about exercise goals. ? ?Disease prevention: ?If you smoke or chew tobacco, find out from your caregiver how to quit. It can literally save your life, no matter how long you have been a tobacco user. If you do not use tobacco, never begin.  ?Maintain a healthy diet and normal weight. Increased weight leads to problems with blood pressure and diabetes.  ?The Body Mass Index or BMI is a way of measuring how much of your body is fat. Having a BMI above 27 increases the risk of heart disease, diabetes, hypertension, stroke and other problems related to obesity. Your caregiver can help determine your BMI and based on it develop   an exercise and dietary program to help you achieve or maintain this important measurement at a healthful level. ?High blood pressure causes heart and blood vessel problems.  Persistent high blood pressure should be treated with medicine if weight loss and exercise do not work.  ?Fat and cholesterol leaves deposits in your arteries that can block them. This causes heart  disease and vessel disease elsewhere in your body.  If your cholesterol is found to be high, or if you have heart disease or certain other medical conditions, then you may need to have your cholesterol monitored frequently and be treated with medication.  ?Ask if you should have a stress test if your history suggests this. A stress test is a test done on a treadmill that looks for heart disease. This test can find disease prior to there being a problem. ?Avoid drinking alcohol in excess (more than two drinks per day).  Avoid use of street drugs. Do not share needles with anyone. Ask for professional help if you need assistance or instructions on stopping the use of alcohol, cigarettes, and/or drugs. ?Brush your teeth twice a day with fluoride toothpaste, and floss once a day. Good oral hygiene prevents tooth decay and gum disease. The problems can be painful, unattractive, and can cause other health problems. Visit your dentist for a routine oral and dental check up and preventive care every 6-12 months.  ?Look at your skin regularly.  Use a mirror to look at your back. Notify your caregivers of changes in moles, especially if there are changes in shapes, colors, a size larger than a pencil eraser, an irregular border, or development of new moles. ? ?Safety: ?Use seatbelts 100% of the time, whether driving or as a passenger.  Use safety devices such as hearing protection if you work in environments with loud noise or significant background noise.  Use safety glasses when doing any work that could send debris in to the eyes.  Use a helmet if you ride a bike or motorcycle.  Use appropriate safety gear for contact sports.  Talk to your caregiver about gun safety. ?Use sunscreen with a SPF (or skin protection factor) of 15 or greater.  Lighter skinned people are at a greater risk of skin cancer. Don?t forget to also wear sunglasses in order to protect your eyes from too much damaging sunlight. Damaging sunlight can  accelerate cataract formation.  ?Practice safe sex. Use condoms. Condoms are used for birth control and to help reduce the spread of sexually transmitted infections (or STIs).  Some of the STIs are gonorrhea (the clap), chlamydia, syphilis, trichomonas, herpes, HPV (human papilloma virus) and HIV (human immunodeficiency virus) which causes AIDS. The herpes, HIV and HPV are viral illnesses that have no cure. These can result in disability, cancer and death.  ?Keep carbon monoxide and smoke detectors in your home functioning at all times. Change the batteries every 6 months or use a model that plugs into the wall.  ? ?Vaccinations: ?Stay up to date with your tetanus shots and other required immunizations. You should have a booster for tetanus every 10 years. Be sure to get your flu shot every year, since 5%-20% of the [place] population comes down with the flu. The flu vaccine changes each year, so being vaccinated once is not enough. Get your shot in the fall, before the flu season peaks.   ?Other vaccines to consider: ?Pneumococcal vaccine to protect against certain types of pneumonia.  This is normally recommended for adults age   65 or older.  However, adults younger than 56 years old with certain underlying conditions such as diabetes, heart or lung disease should also receive the vaccine. ?Shingles vaccine to protect against Varicella Zoster if you are older than age 60, or younger than 56 years old with certain underlying illness. ?Hepatitis A vaccine to protect against a form of infection of the liver by a virus acquired from food. ?Hepatitis B vaccine to protect against a form of infection of the liver by a virus acquired from blood or body fluids, particularly if you work in health care. ?If you plan to travel internationally, check with your local health department for specific vaccination recommendations. ? ?Cancer Screening: ?Most routine colon cancer screening begins at the age of 50. On a yearly basis,  doctors may provide special easy to use take-home tests to check for hidden blood in the stool. Sigmoidoscopy or colonoscopy can detect the earliest forms of colon cancer and is life saving. These tests use a smal

## 2021-07-19 NOTE — Progress Notes (Deleted)
? ?Complete physical exam ? ? ?Patient: Marco Campbell   DOB: 1966/03/20   56 y.o. Male  MRN: [id number] ?Visit Date: 07/20/2021 ? ?No chief complaint on file. ? ?Subjective  ?  ?Marco Campbell is a 56 y.o. male who presents today for a complete physical exam.  ? ?Reports is generally feeling well, fairly well, poorly; is eating a *** diet; is sleeping well ***; drinks *** bottles of water a day; is exercising at home at the gym, not exercising, has a strenuous job but no regular exercise; doesn't have any new issues to discuss  ? ? ?HPI  ?*** ? ?Past Medical History:  ?Diagnosis Date  ? AKI (acute kidney injury) ([place]) 07/09/2016  ? Allergy   ? Arthritis   ? Blurry vision 07/09/2016  ? Controlled type 2 diabetes mellitus without complication, without long-term current use of insulin ([place]) 04/09/2017  ? Diabetes mellitus without complication ([place])   ? Elevated LDL cholesterol level 03/05/2017  ? Hyperlipidemia   ? Hypertension   ? Sleep apnea   ? Does not use CPAP  ? SOB (shortness of breath) 11/07/2017  ? ?No past surgical history on file. ?Social History  ? ?Socioeconomic History  ? Marital status: Divorced  ?  Spouse name: Not on file  ? Number of children: Not on file  ? Years of education: Not on file  ? Highest education level: Not on file  ?Occupational History  ? Not on file  ?Tobacco Use  ? Smoking status: Every Day  ?  Years: 30.00  ?  Types: Cigarettes  ? Smokeless tobacco: Never  ?Substance and Sexual Activity  ? Alcohol use: Yes  ?  Comment: occasionally  ? Drug use: No  ? Sexual activity: Not on file  ?Other Topics Concern  ? Not on file  ?Social History Narrative  ? Not on file  ? ?Social Determinants of Health  ? ?Financial Resource Strain: Not on file  ?Food Insecurity: Not on file  ?Transportation Needs: Not on file  ?Physical Activity: Not on file  ?Stress: Not on file  ?Social Connections: Not on file  ?Intimate Partner Violence: Not on file  ? ?Family Status  ?Relation Name Status  ? Mother   Deceased  ? Father  Deceased  ? Brother  Alive  ? MGM  Deceased  ? Sister  Alive  ? Neg Hx  (Not Specified)  ? ?Family History  ?Problem Relation Age of Onset  ? Diabetes Mother   ? Heart failure Mother   ?     Died age 69  ? Diabetes Father   ? Heart disease Father   ? Heart attack Father 57  ?     CABG twice.  Died age 48  ? Hypertension Brother   ? Diabetes Brother   ? Heart failure Brother 40  ? Diabetes Maternal Grandmother   ? Diabetes Sister   ? Heart failure Sister 60  ? Colon cancer Neg Hx   ? Esophageal cancer Neg Hx   ? Rectal cancer Neg Hx   ? Stomach cancer Neg Hx   ? ?Allergies  ?Allergen Reactions  ? Bee Venom Hives  ? Metformin And Related Diarrhea  ?  Tried XR as well   ?  ?Patient Care Team: ?Marcellina Millin as PCP - General (Physician Assistant) ?Minus Breeding, MD as PCP - Cardiology (Cardiology)  ? ?Medications: ?Outpatient Medications Prior to Visit  ?Medication Sig  ? atorvastatin (LIPITOR) 20 MG tablet  Take 1 tablet (20 mg total) by mouth daily.  ? BAYER MICROLET LANCETS lancets by Other route. Pt was given this in office 09/05/16 pfm  ? blood glucose meter kit and supplies Dispense based on patient and insurance preference. Use up to four times daily as directed. (FOR ICD-9 250.00, 250.01).  ? dapagliflozin propanediol (FARXIGA) 5 MG TABS tablet TAKE 1 TABLET BY MOUTH EVERY DAY BEFORE BREAKFAST  ? EPINEPHrine 0.3 mg/0.3 mL IJ SOAJ injection Inject 0.3 mLs (0.3 mg total) into the muscle as needed for anaphylaxis. (Patient not taking: Reported on 06/12/2021)  ? glucose blood test strip Contour test strips. Please provide strips for meter patient use. Test 1-2 times daily  ? ibuprofen (ADVIL,MOTRIN) 600 MG tablet Take 1 tablet (600 mg total) by mouth every 6 (six) hours as needed. (Patient not taking: Reported on 06/12/2021)  ? lisinopril-hydrochlorothiazide (ZESTORETIC) 20-12.5 MG tablet Take 1 tablet by mouth daily.  ? ?No facility-administered medications prior to visit.  ? ? ?Review  of Systems ? ?{Labs (Optional):23779} ? ?The 10-year ASCVD risk score (Arnett DK, et al., 2019) is: 35% ? ? Objective  ?  ?There were no vitals taken for this visit. ? ?{Show previous vital signs (optional):23777} ? ? ?Physical Exam  ?*** ? ?Last depression screening scores ? ?  06/12/2021  ?  4:06 PM 10/16/2019  ?  3:18 PM 09/15/2019  ?  3:02 PM  ?PHQ 2/9 Scores  ?PHQ - 2 Score 0 0 0  ? ?Last fall risk screening ? ?  06/12/2021  ?  4:06 PM  ?Fall Risk   ?Falls in the past year? 0  ?Number falls in past yr: 0  ?Injury with Fall? 0  ?Risk for fall due to : No Fall Risks  ?Follow up Falls evaluation completed  ? ? ? ?No results found for any visits on 07/20/21. ? Assessment & Plan  ?  ?Routine Health Maintenance and Physical Exam ? ?Exercise Activities and Dietary recommendations ? Goals   ?None ?  ? ? ?Immunization History  ?Administered Date(s) Administered  ? PFIZER(Purple Top)SARS-COV-2 Vaccination 06/20/2019, 07/11/2019  ? PPD Test 09/18/2017  ? Pneumococcal Conjugate-13 07/02/2018  ? Tdap 09/18/2017  ? ? ?Health Maintenance  ?Topic Date Due  ? COVID-19 Vaccine (3 - Booster for Pfizer series) 09/05/2019  ? Zoster Vaccines- Shingrix (1 of 2) 09/09/2021 (Originally 10/28/2015)  ? FOOT EXAM  11/07/2021 (Originally 09/14/2020)  ? OPHTHALMOLOGY EXAM  11/07/2021 (Originally 01/08/2020)  ? Hepatitis C Screening  06/12/2022 (Originally 10/28/1983)  ? HEMOGLOBIN A1C  12/10/2021  ? COLONOSCOPY (Pts 45-22yr Insurance coverage will need to be confirmed)  12/10/2025  ? TETANUS/TDAP  09/19/2027  ? HIV Screening  Completed  ? HPV VACCINES  Aged Out  ? INFLUENZA VACCINE  Discontinued  ? ? ?Discussed health benefits of physical activity, and encouraged him to engage in regular exercise appropriate for his age and condition. ? ?Problem List Items Addressed This Visit   ?None ?  ? ?No follow-ups on file.  ?  ? ?LIrene Pap PA-C  ?

## 2021-07-20 ENCOUNTER — Encounter: Payer: BC Managed Care – PPO | Admitting: Physician Assistant

## 2021-07-20 DIAGNOSIS — E1169 Type 2 diabetes mellitus with other specified complication: Secondary | ICD-10-CM

## 2021-07-20 DIAGNOSIS — E1159 Type 2 diabetes mellitus with other circulatory complications: Secondary | ICD-10-CM

## 2021-07-20 DIAGNOSIS — R7989 Other specified abnormal findings of blood chemistry: Secondary | ICD-10-CM

## 2021-07-20 DIAGNOSIS — E119 Type 2 diabetes mellitus without complications: Secondary | ICD-10-CM

## 2021-07-20 DIAGNOSIS — Z0000 Encounter for general adult medical examination without abnormal findings: Secondary | ICD-10-CM

## 2021-08-09 NOTE — Progress Notes (Signed)
? ?Complete physical exam ? ? ?Patient: Marco Campbell   DOB: [birth year]   56 y.o. Male  MRN: [id number] ?Visit Date: 08/10/2021 ? ?Chief Complaint  ?Patient presents with  ? Annual Exam  ?  Fasting CPE- He also has a bump on penis  ? ?Subjective  ?  ?ISIDOR BROMELL is a 56 y.o. male who presents today for a complete physical exam.  ? ?Reports is generally feeling well; is eating a healthy diet; is sleeping well 6 - 8 hours;  drinks 1 - 2  bottles of water a day; is exercising active at work at least 5 miles a day;  reports a bump on penis x 2 days; denies fever, denies dysuria; denies new sexual contacts; denies trauma to penis. ? ? ?HPI ?HPI   ? ? Annual Exam   ? Additional comments: Fasting CPE- He also has a bump on penis ? ?  ?  ?Last edited by Deforest Hoyles, [place] on 08/10/2021  2:11 PM.  ?  ?  ? ? ?Past Medical History:  ?Diagnosis Date  ? AKI (acute kidney injury) ([place]) 07/09/2016  ? Allergy   ? Arthritis   ? Blurry vision 07/09/2016  ? Controlled type 2 diabetes mellitus without complication, without long-term current use of insulin ([place]) 04/09/2017  ? Diabetes mellitus without complication ([place])   ? Elevated LDL cholesterol level 03/05/2017  ? Hyperlipidemia   ? Hypertension   ? Sleep apnea   ? Does not use CPAP  ? SOB (shortness of breath) 11/07/2017  ? ?History reviewed. No pertinent surgical history. ?Social History  ? ?Socioeconomic History  ? Marital status: Divorced  ?  Spouse name: Not on file  ? Number of children: Not on file  ? Years of education: Not on file  ? Highest education level: Not on file  ?Occupational History  ? Not on file  ?Tobacco Use  ? Smoking status: Every Day  ?  Years: 30.00  ?  Types: Cigarettes  ? Smokeless tobacco: Never  ?Substance and Sexual Activity  ? Alcohol use: Yes  ?  Comment: occasionally  ? Drug use: No  ? Sexual activity: Not on file  ?Other Topics Concern  ? Not on file  ?Social History Narrative  ? Not on file  ? ?Social Determinants of Health  ? ?Financial Resource  Strain: Not on file  ?Food Insecurity: Not on file  ?Transportation Needs: Not on file  ?Physical Activity: Not on file  ?Stress: Not on file  ?Social Connections: Not on file  ?Intimate Partner Violence: Not on file  ? ?Family Status  ?Relation Name Status  ? Mother  Deceased  ? Father  Deceased  ? Brother  Alive  ? MGM  Deceased  ? Sister  Alive  ? Neg Hx  (Not Specified)  ? ?Family History  ?Problem Relation Age of Onset  ? Diabetes Mother   ? Heart failure Mother   ?     Died age 76  ? Diabetes Father   ? Heart disease Father   ? Heart attack Father 93  ?     CABG twice.  Died age 32  ? Hypertension Brother   ? Diabetes Brother   ? Heart failure Brother 40  ? Diabetes Maternal Grandmother   ? Diabetes Sister   ? Heart failure Sister 64  ? Colon cancer Neg Hx   ? Esophageal cancer Neg Hx   ? Rectal cancer Neg Hx   ? Stomach cancer  Neg Hx   ? ?Allergies  ?Allergen Reactions  ? Bee Venom Hives  ? Metformin And Related Diarrhea  ?  Tried XR as well   ?  ?Patient Care Team: ?Marcellina Millin as PCP - General (Physician Assistant) ?Minus Breeding, MD as PCP - Cardiology (Cardiology)  ? ?Medications: ?Outpatient Medications Prior to Visit  ?Medication Sig  ? atorvastatin (LIPITOR) 20 MG tablet Take 1 tablet (20 mg total) by mouth daily.  ? BAYER MICROLET LANCETS lancets by Other route. Pt was given this in office 09/05/16 pfm  ? blood glucose meter kit and supplies Dispense based on patient and insurance preference. Use up to four times daily as directed. (FOR ICD-9 250.00, 250.01).  ? dapagliflozin propanediol (FARXIGA) 5 MG TABS tablet TAKE 1 TABLET BY MOUTH EVERY DAY BEFORE BREAKFAST  ? glucose blood test strip Contour test strips. Please provide strips for meter patient use. Test 1-2 times daily  ? lisinopril-hydrochlorothiazide (ZESTORETIC) 20-12.5 MG tablet Take 1 tablet by mouth daily.  ? EPINEPHrine 0.3 mg/0.3 mL IJ SOAJ injection Inject 0.3 mLs (0.3 mg total) into the muscle as needed for anaphylaxis.  (Patient not taking: Reported on 06/12/2021)  ? ?No facility-administered medications prior to visit.  ? ? ?Review of Systems  ?Constitutional:  Negative for activity change and fever.  ?HENT:  Negative for congestion, ear pain and voice change.   ?Eyes:  Negative for redness.  ?Respiratory:  Negative for cough.   ?Cardiovascular:  Negative for chest pain.  ?Gastrointestinal:  Negative for constipation and diarrhea.  ?Endocrine: Negative for polyuria.  ?Genitourinary:  Negative for flank pain, hematuria, penile discharge, penile pain, penile swelling, scrotal swelling and testicular pain.  ?Musculoskeletal:  Negative for gait problem and neck stiffness.  ?Skin:  Positive for rash. Negative for color change.  ?Neurological:  Negative for dizziness.  ?Hematological:  Negative for adenopathy.  ?Psychiatric/Behavioral:  Negative for agitation, behavioral problems and confusion.   ? ?Last CBC ?Lab Results  ?Component Value Date  ? WBC 10.2 06/12/2021  ? HGB 15.2 06/12/2021  ? HCT 44.2 06/12/2021  ? MCV 85 06/12/2021  ? MCH 29.2 06/12/2021  ? RDW 12.9 06/12/2021  ? PLT 263 06/12/2021  ? ?Last metabolic panel ?Lab Results  ?Component Value Date  ? GLUCOSE 85 06/12/2021  ? NA 140 06/12/2021  ? K 4.1 06/12/2021  ? CL 104 06/12/2021  ? CO2 23 06/12/2021  ? BUN 20 06/12/2021  ? CREATININE 1.22 06/12/2021  ? EGFR 70 06/12/2021  ? CALCIUM 9.1 06/12/2021  ? PROT 7.1 06/12/2021  ? ALBUMIN 4.2 06/12/2021  ? LABGLOB 2.9 06/12/2021  ? AGRATIO 1.4 06/12/2021  ? BILITOT <0.2 06/12/2021  ? ALKPHOS 75 06/12/2021  ? AST 18 06/12/2021  ? ALT 19 06/12/2021  ? ANIONGAP 8 07/10/2016  ? ?Last lipids ?Lab Results  ?Component Value Date  ? CHOL 175 06/12/2021  ? HDL 48 06/12/2021  ? [place] 94 06/12/2021  ? TRIG 196 (H) 06/12/2021  ? CHOLHDL 3.6 06/12/2021  ? ?Last hemoglobin A1c ?Lab Results  ?Component Value Date  ? HGBA1C 6.4 (H) 06/12/2021  ? ?Last thyroid functions ?Lab Results  ?Component Value Date  ? TSH 0.646 07/08/2020  ? ?Last vitamin  D ?Lab Results  ?Component Value Date  ? VD25OH 10.0 (L) 07/08/2020  ? ?Last vitamin B12 and Folate ?Lab Results  ?Component Value Date  ? PRXYVOPF29 491 07/08/2020  ? ?  ? ?The 10-year ASCVD risk score (Arnett DK, et al., 2019) is:  28.1% ? ? Objective  ?  ?BP 120/80   Pulse 86   Wt 246 lb (111.6 kg)   SpO2 95%   BMI 32.46 kg/m?  ? ?  ? ? ?Physical Exam ?Vitals and nursing note reviewed.  ?Constitutional:   ?   General: He is not in acute distress. ?   Appearance: Normal appearance.  ?HENT:  ?   Head: Normocephalic and atraumatic.  ?   Right Ear: Tympanic membrane, ear canal and external ear normal.  ?   Left Ear: Tympanic membrane, ear canal and external ear normal.  ?   Nose: No congestion.  ?Eyes:  ?   Extraocular Movements: Extraocular movements intact.  ?   Conjunctiva/sclera: Conjunctivae normal.  ?   Pupils: Pupils are equal, round, and reactive to light.  ?Neck:  ?   Vascular: No carotid bruit.  ?Cardiovascular:  ?   Rate and Rhythm: Normal rate and regular rhythm.  ?   Pulses: Normal pulses.  ?   Heart sounds: Normal heart sounds.  ?Pulmonary:  ?   Effort: Pulmonary effort is normal.  ?   Breath sounds: Normal breath sounds. No wheezing.  ?Abdominal:  ?   General: Bowel sounds are normal.  ?   Palpations: Abdomen is soft.  ?Genitourinary: ?   Comments: Small 1/2 cm circular flesh colored abrasion on shaft of penis, no erythema, no discharge, no swelling, patient declined STI testing ?Musculoskeletal:     ?   General: Normal range of motion.  ?   Cervical back: Normal range of motion and neck supple.  ?   Right lower leg: No edema.  ?   Left lower leg: No edema.  ?Skin: ?   General: Skin is warm and dry.  ?   Findings: No rash.  ?Neurological:  ?   Mental Status: He is alert and oriented to person, place, and time.  ?   Gait: Gait normal.  ?Psychiatric:     ?   Mood and Affect: Mood normal.     ?   Behavior: Behavior normal.  ?  ? ? ?Last depression screening scores ? ?  08/10/2021  ?  2:13 PM 06/12/2021   ?  4:06 PM 10/16/2019  ?  3:18 PM  ?PHQ 2/9 Scores  ?PHQ - 2 Score 0 0 0  ? ?Last fall risk screening ? ?  08/10/2021  ?  2:12 PM  ?Fall Risk   ?Falls in the past year? 0  ?Number falls in past yr: 0  ?[place]

## 2021-08-10 ENCOUNTER — Ambulatory Visit: Payer: BC Managed Care – PPO | Admitting: Physician Assistant

## 2021-08-10 VITALS — BP 120/80 | HR 86 | Ht 73.0 in | Wt 246.0 lb

## 2021-08-10 DIAGNOSIS — S30812A Abrasion of penis, initial encounter: Secondary | ICD-10-CM

## 2021-08-10 DIAGNOSIS — I152 Hypertension secondary to endocrine disorders: Secondary | ICD-10-CM | POA: Insufficient documentation

## 2021-08-10 DIAGNOSIS — E119 Type 2 diabetes mellitus without complications: Secondary | ICD-10-CM | POA: Diagnosis not present

## 2021-08-10 DIAGNOSIS — E1169 Type 2 diabetes mellitus with other specified complication: Secondary | ICD-10-CM

## 2021-08-10 DIAGNOSIS — E785 Hyperlipidemia, unspecified: Secondary | ICD-10-CM

## 2021-08-10 DIAGNOSIS — Z0000 Encounter for general adult medical examination without abnormal findings: Secondary | ICD-10-CM

## 2021-08-10 DIAGNOSIS — E6609 Other obesity due to excess calories: Secondary | ICD-10-CM

## 2021-08-10 DIAGNOSIS — Z6832 Body mass index (BMI) 32.0-32.9, adult: Secondary | ICD-10-CM

## 2021-08-10 DIAGNOSIS — I10 Essential (primary) hypertension: Secondary | ICD-10-CM | POA: Diagnosis not present

## 2021-08-11 ENCOUNTER — Encounter: Payer: Self-pay | Admitting: Physician Assistant

## 2021-08-11 DIAGNOSIS — E6609 Other obesity due to excess calories: Secondary | ICD-10-CM | POA: Insufficient documentation

## 2021-08-11 NOTE — Assessment & Plan Note (Signed)
controlled, continue Zestoretic, eat a low salt diet, do not add any salt to food when cooking, avoid processed foods, avoid fried foods ? ?

## 2021-08-11 NOTE — Patient Instructions (Signed)
Preventative Care for Adults, Male ?   ?   REGULAR HEALTH EXAMS: ?A routine yearly physical is a good way to check in with your primary care provider about your health and preventive screening. It is also an opportunity to share updates about your health and any concerns you have, and receive a thorough all-over exam.  ?Most health insurance companies pay for at least some preventative services.  Check with your health plan for specific coverages. ? ?WHAT PREVENTATIVE SERVICES DO MEN NEED? ?Adult men should have their weight and blood pressure checked regularly.  ?Men age 35 and older should have their cholesterol levels checked regularly. ?Beginning at age 45 and continuing to age 75, men should be screened for colorectal cancer.  Certain people should may need continued testing until age 85. ?Other cancer screening may include exams for testicular and prostate cancer. ?Updating vaccinations is part of preventative care.  Vaccinations help protect against diseases such as the flu. ?Lab tests are generally done as part of preventative care to screen for anemia and blood disorders, to screen for problems with the kidneys and liver, to screen for bladder problems, to check blood sugar, and to check your cholesterol level. ?Preventative services generally include counseling about diet, exercise, avoiding tobacco, drugs, excessive alcohol consumption, and sexually transmitted infections.   ? ?GENERAL RECOMMENDATIONS FOR GOOD HEALTH: ? ?Healthy diet: ?Eat a variety of foods, including fruit, vegetables, animal or vegetable protein, such as meat, fish, chicken, and eggs, or beans, lentils, tofu, and grains, such as rice. ?Drink plenty of water daily (60 - 80 ounces or 8 - 10 glasses of water a day) ?Decrease saturated fat in the diet, avoid lots of red meat, processed foods, sweets, fast foods, and fried foods. For high cholesterol - Increase fiber intake (Benefiber or Metamucil, Cherrios,  oatmeal, beans, nuts, fruits  and vegetables), limit saturated fats (in fried foods, red meat), can add OTC fish oil supplement, eat fish with Omega-3 fatty acids like salmon and tuna, exercise for 30 minutes 3 - 5 times a week, drink 8 - 10 glasses of water a day. ? ?Exercise: ?Aerobic exercise helps maintain good heart health. At least 30-40 minutes of moderate-intensity exercise is recommended. For example, a brisk walk that increases your heart rate and breathing. This should be done on most days of the week.  ?Find a type of exercise or a variety of exercises that you enjoy so that it becomes a part of your daily life.  Examples are running, walking, swimming, water aerobics, and biking.  For motivation and support, explore group exercise such as aerobic class, spin class, Zumba, Yoga,or  martial arts, etc.   ?Set exercise goals for yourself, such as a certain weight goal, walk or run in a race such as a 5k walk/run.  Speak to your primary care provider about exercise goals. ? ?Disease prevention: ?If you smoke or chew tobacco, find out from your caregiver how to quit. It can literally save your life, no matter how long you have been a tobacco user. If you do not use tobacco, never begin.  ?Maintain a healthy diet and normal weight. Increased weight leads to problems with blood pressure and diabetes.  ?The Body Mass Index or BMI is a way of measuring how much of your body is fat. Having a BMI above 27 increases the risk of heart disease, diabetes, hypertension, stroke and other problems related to obesity. Your caregiver can help determine your BMI and based on it develop   an exercise and dietary program to help you achieve or maintain this important measurement at a healthful level. ?High blood pressure causes heart and blood vessel problems.  Persistent high blood pressure should be treated with medicine if weight loss and exercise do not work.  ?Fat and cholesterol leaves deposits in your arteries that can block them. This causes heart  disease and vessel disease elsewhere in your body.  If your cholesterol is found to be high, or if you have heart disease or certain other medical conditions, then you may need to have your cholesterol monitored frequently and be treated with medication.  ?Ask if you should have a stress test if your history suggests this. A stress test is a test done on a treadmill that looks for heart disease. This test can find disease prior to there being a problem. ?Avoid drinking alcohol in excess (more than two drinks per day).  Avoid use of street drugs. Do not share needles with anyone. Ask for professional help if you need assistance or instructions on stopping the use of alcohol, cigarettes, and/or drugs. ?Brush your teeth twice a day with fluoride toothpaste, and floss once a day. Good oral hygiene prevents tooth decay and gum disease. The problems can be painful, unattractive, and can cause other health problems. Visit your dentist for a routine oral and dental check up and preventive care every 6-12 months.  ?Look at your skin regularly.  Use a mirror to look at your back. Notify your caregivers of changes in moles, especially if there are changes in shapes, colors, a size larger than a pencil eraser, an irregular border, or development of new moles. ? ?Safety: ?Use seatbelts 100% of the time, whether driving or as a passenger.  Use safety devices such as hearing protection if you work in environments with loud noise or significant background noise.  Use safety glasses when doing any work that could send debris in to the eyes.  Use a helmet if you ride a bike or motorcycle.  Use appropriate safety gear for contact sports.  Talk to your caregiver about gun safety. ?Use sunscreen with a SPF (or skin protection factor) of 15 or greater.  Lighter skinned people are at a greater risk of skin cancer. Don?t forget to also wear sunglasses in order to protect your eyes from too much damaging sunlight. Damaging sunlight can  accelerate cataract formation.  ?Practice safe sex. Use condoms. Condoms are used for birth control and to help reduce the spread of sexually transmitted infections (or STIs).  Some of the STIs are gonorrhea (the clap), chlamydia, syphilis, trichomonas, herpes, HPV (human papilloma virus) and HIV (human immunodeficiency virus) which causes AIDS. The herpes, HIV and HPV are viral illnesses that have no cure. These can result in disability, cancer and death.  ?Keep carbon monoxide and smoke detectors in your home functioning at all times. Change the batteries every 6 months or use a model that plugs into the wall.  ? ?Vaccinations: ?Stay up to date with your tetanus shots and other required immunizations. You should have a booster for tetanus every 10 years. Be sure to get your flu shot every year, since 5%-20% of the [place] population comes down with the flu. The flu vaccine changes each year, so being vaccinated once is not enough. Get your shot in the fall, before the flu season peaks.   ?Other vaccines to consider: ?Pneumococcal vaccine to protect against certain types of pneumonia.  This is normally recommended for adults age   65 or older.  However, adults younger than 56 years old with certain underlying conditions such as diabetes, heart or lung disease should also receive the vaccine. ?Shingles vaccine to protect against Varicella Zoster if you are older than age 60, or younger than 56 years old with certain underlying illness. ?Hepatitis A vaccine to protect against a form of infection of the liver by a virus acquired from food. ?Hepatitis B vaccine to protect against a form of infection of the liver by a virus acquired from blood or body fluids, particularly if you work in health care. ?If you plan to travel internationally, check with your local health department for specific vaccination recommendations. ? ?Cancer Screening: ?Most routine colon cancer screening begins at the age of 50. On a yearly basis,  doctors may provide special easy to use take-home tests to check for hidden blood in the stool. Sigmoidoscopy or colonoscopy can detect the earliest forms of colon cancer and is life saving. These tests use a smal

## 2021-08-11 NOTE — Assessment & Plan Note (Signed)
controlled, eat a low sugar diet, avoid starchy food with a lot of carbohydrates, avoid fried and processed foods; last hgb a1c 6.4 on 06/12/2021 ? ?

## 2021-08-11 NOTE — Assessment & Plan Note (Signed)
>>  ASSESSMENT AND PLAN FOR PRIMARY HYPERTENSION WRITTEN ON 08/11/2021  9:45 AM BY Mayford Knife, LYNNE B, PA-C  controlled, continue Zestoretic, eat a low salt diet, do not add any salt to food when cooking, avoid processed foods, avoid fried foods

## 2021-08-11 NOTE — Assessment & Plan Note (Signed)
Stable, drink 8 - 10 glasses of water daily, limit sugar intake and eat a low fat diet, exercise 3 - 5 days a week, example walking 1 - 2 miles  ? ?

## 2021-08-11 NOTE — Assessment & Plan Note (Signed)
controlled, continue lipitor, eat a low fat diet, increase fiber intake (Benefiber or Metamucil, Cherrios,  oatmeal, beans, nuts, fruits and vegetables), limit saturated fats (in fried foods, red meat), can add OTC fish oil supplement, eat fish with Omega-3 fatty acids like salmon and tuna, exercise for 30 minutes 3 - 5 times a week, drink 8 - 10 glasses of water a day ? ? ?

## 2021-09-21 ENCOUNTER — Inpatient Hospital Stay (HOSPITAL_COMMUNITY)
Admission: RE | Admit: 2021-09-21 | Discharge: 2021-09-21 | Disposition: A | Discharge status: Home / Self Care | Payer: BC Managed Care – PPO | Source: Ambulatory Visit | Attending: Physician Assistant | Admitting: Physician Assistant

## 2021-09-21 ENCOUNTER — Other Ambulatory Visit: Payer: Self-pay | Admitting: Nurse Practitioner

## 2021-09-21 ENCOUNTER — Ambulatory Visit: Payer: Self-pay

## 2021-09-21 DIAGNOSIS — M25511 Pain in right shoulder: Secondary | ICD-10-CM

## 2021-09-22 ENCOUNTER — Telehealth (HOSPITAL_COMMUNITY): Payer: Self-pay | Admitting: Emergency Medicine

## 2021-09-22 ENCOUNTER — Ambulatory Visit (HOSPITAL_COMMUNITY): Payer: BC Managed Care – PPO

## 2021-09-22 NOTE — Telephone Encounter (Signed)
Pt states that he has already been seen by [facility] wellness and does not need an appointment with [facility].

## 2021-11-21 HISTORY — PX: ROTATOR CUFF REPAIR: SHX139

## 2021-11-22 ENCOUNTER — Telehealth: Payer: Self-pay | Admitting: Physician Assistant

## 2021-11-22 ENCOUNTER — Other Ambulatory Visit: Payer: Self-pay | Admitting: Medical

## 2021-11-22 MED ORDER — VARENICLINE TARTRATE 1 MG PO TABS: 1.0000 mg | ORAL_TABLET | Freq: Two times a day (BID) | ORAL | 0 refills | Status: DC

## 2021-11-22 NOTE — Telephone Encounter (Signed)
Pt was a pt of [facility] then [facility]. Pt called and states that he was given a rx for Chantx and discuss the issue of not smoking previously with [facility] and was given a RX for Chantix but never had it filled. He would like to have that sent in again. He is having upcoming surgery and would really like to quit prior to that. Pt can be reached at [phone] and uses [facility].

## 2021-12-19 ENCOUNTER — Other Ambulatory Visit: Payer: Self-pay | Admitting: Medical

## 2021-12-19 NOTE — Telephone Encounter (Signed)
Is this okay to refill? 

## 2021-12-19 NOTE — Telephone Encounter (Signed)
[facility] to [facility] pt [facility].. please advise Kh

## 2022-02-18 NOTE — Progress Notes (Unsigned)
No chief complaint on file.   Patient presents for 6 month follow-up on chronic issues.  Last seen by Sula Soda for CPE in April.  Tobacco use--this was discussed with [facility]ula Soda.  He called for Chantix rx in August, as he wanted to quit smoking before surgery.  Hypertension follow-up:  Blood pressures elsewhere are ***.   Denies dizziness, headaches, chest pain.  Denies side effects of medications, reports compliance with Lisinopril-HCT. BP Readings from Last 3 Encounters:  08/10/21 120/80  06/12/21 138/80  07/08/20 130/80    Diabetes follow-up:  Blood sugars at home are running ***.   He reports compliance with Wilder Glade and denies side effects. Denies hypoglycemia.  Denies polydipsia and polyuria.  Last A1c was 6.4% in 05/2021. Last eye exam was ***.  Patient follows a low sugar diet and checks feet regularly without concerns.   Hyperlipidemia follow-up:  Patient is reportedly following a low-fat, low cholesterol diet.  Compliant with medications (atorvastatin '20mg'$ ) and denies medication side effects. LDL was at goal, TG elevated on last check.  Lab Results  Component Value Date   CHOL 175 06/12/2021   HDL 48 06/12/2021   LDLCALC 94 06/12/2021   TRIG 196 (H) 06/12/2021   CHOLHDL 3.6 06/12/2021   Vitamin D deficiency:  Last level was low at 10.0 in 06/2020.  This wasn't rechecked.  He is currently taking    PMH, PSH, SH reviewed   ROS: Denies fever, chills, dizziness, headaches, edema, chest pain, palpitations, shortness of breath, URI symptoms. Denies GI and GU complaints. No bleeding, bruising, rash.   PHYSICAL EXAM:  There were no vitals taken for this visit.  Wt Readings from Last 3 Encounters:  08/10/21 246 lb (111.6 kg)  06/12/21 251 lb 6.4 oz (114 kg)  07/08/20 253 lb (114.8 kg)    Well developed, well nourished patient, in no distress HEENT: conjunctiva and sclera are clear, EOMI.  Neck: No lymphadenopathy or thyromegaly, no carotid bruit Heart:  Regular rate and  rhythm, no murmurs, rubs, gallops or ectopy Lungs:  Clear bilaterally, without wheezes, rales or ronchi Abdomen:  Soft, nontender, nondistended, no hepatosplenomegaly or masses, normal bowel sounds Extremities:  No clubbing, cyanosis or edema, 2+ pulses. Normal monofilament exam. Neuro:  Alert and oriented x 3, cranial nerves grossly intact. Normal gait. Back:  No spine or CVA tenderness Skin: no rashes or suspicious lesions Psych:  Normal mood, affect, hygiene and grooming, normal speech, eye contact  ***UPDATE ALL  DIABETIC FOOT EXAM  ASSESSMENT/PLAN:  A1c Urine microalbumin Vitamin D  Flu and covid (decline if he refuses) Last eye exam abstracted was from 2020--has he had one since?  If not, remind, if so, please get report. Did he have some sort of surgery in August/Sept?  Nothing in chart Smoking?? Is he taking any MVI or Vitamin D currently?

## 2022-02-19 ENCOUNTER — Ambulatory Visit: Payer: BC Managed Care – PPO | Admitting: Family Medicine

## 2022-02-19 ENCOUNTER — Encounter: Payer: Self-pay | Admitting: *Deleted

## 2022-02-19 ENCOUNTER — Encounter: Payer: Self-pay | Admitting: Family Medicine

## 2022-02-19 VITALS — BP 120/80 | HR 80 | Ht 73.0 in | Wt 240.6 lb

## 2022-02-19 DIAGNOSIS — E559 Vitamin D deficiency, unspecified: Secondary | ICD-10-CM | POA: Diagnosis not present

## 2022-02-19 DIAGNOSIS — Z7185 Encounter for immunization safety counseling: Secondary | ICD-10-CM

## 2022-02-19 DIAGNOSIS — E6609 Other obesity due to excess calories: Secondary | ICD-10-CM

## 2022-02-19 DIAGNOSIS — E1159 Type 2 diabetes mellitus with other circulatory complications: Secondary | ICD-10-CM | POA: Diagnosis not present

## 2022-02-19 DIAGNOSIS — E1169 Type 2 diabetes mellitus with other specified complication: Secondary | ICD-10-CM | POA: Diagnosis not present

## 2022-02-19 DIAGNOSIS — R208 Other disturbances of skin sensation: Secondary | ICD-10-CM | POA: Diagnosis not present

## 2022-02-19 DIAGNOSIS — Z716 Tobacco abuse counseling: Secondary | ICD-10-CM

## 2022-02-19 DIAGNOSIS — Z6831 Body mass index (BMI) 31.0-31.9, adult: Secondary | ICD-10-CM

## 2022-02-19 DIAGNOSIS — E785 Hyperlipidemia, unspecified: Secondary | ICD-10-CM

## 2022-02-19 DIAGNOSIS — I152 Hypertension secondary to endocrine disorders: Secondary | ICD-10-CM

## 2022-02-19 LAB — POCT GLYCOSYLATED HEMOGLOBIN (HGB A1C): Hemoglobin A1C: 5.9 % — AB (ref 4.0–5.6)

## 2022-02-19 NOTE — Patient Instructions (Addendum)
Please try and quit smoking--start thinking about why/when you smoke (habit, boredom, stress) in order to come up with effective strategies to cut back or quit. Available resources to help you quit include free counseling through [facility] ([facility]uit[facility] or 1-800-QUITNOW), smoking cessation classes through [facility] (call to find out schedule; they used to do this, unsure if it is still going on), over-the-counter nicotine replacements.  Many insurance companies also have smoking cessation programs (which may decrease the cost of patches, meds if enrolled).  If these methods are not effective for you, and you are motivated to quit, let us know if you're interested in re-trying the Chantix, but for the full 3 months, giving you more time to develop the better, non-smoking habits.  I recommend once daily antihistamines for allergies, if/when needed.  These include claritin, zyrtec or allegra (the store-brand versions are fine). Avoid decongestants (things that say "sinus", or contain phenylephrine, or pseudoephedrine), as these raise your blood pressure.  Please schedule a routine yearly diabetic eye exam.  Have them forward a copy of the report to our office.  Try and use some powder to help absorb the moisture/perspiration. Keep some dry socks to change into throughout the day, if needed.

## 2022-02-20 ENCOUNTER — Other Ambulatory Visit: Payer: Self-pay | Admitting: *Deleted

## 2022-02-20 LAB — MICROALBUMIN / CREATININE URINE RATIO
Creatinine, Urine: 69.2 mg/dL
Microalb/Creat Ratio: <4 mg/g creat (ref 0–29)
Microalbumin, Urine: <3 ug/mL

## 2022-02-20 LAB — VITAMIN D 25 HYDROXY (VIT D DEFICIENCY, FRACTURES): Vit D, 25-Hydroxy: 17.8 ng/mL — ABNORMAL LOW (ref 30.0–100.0)

## 2022-02-20 MED ORDER — VITAMIN D (ERGOCALCIFEROL) 1.25 MG (50000 UNIT) PO CAPS: 50000.0000 [IU] | ORAL_CAPSULE | ORAL | 0 refills | Status: DC

## 2022-07-18 ENCOUNTER — Other Ambulatory Visit: Payer: Self-pay | Admitting: Family Medicine

## 2022-07-18 DIAGNOSIS — E1159 Type 2 diabetes mellitus with other circulatory complications: Secondary | ICD-10-CM

## 2022-07-20 ENCOUNTER — Other Ambulatory Visit: Payer: Self-pay | Admitting: Physician Assistant

## 2022-07-20 DIAGNOSIS — E1169 Type 2 diabetes mellitus with other specified complication: Secondary | ICD-10-CM

## 2022-07-23 ENCOUNTER — Telehealth: Payer: Self-pay | Admitting: Nurse Practitioner

## 2022-07-23 DIAGNOSIS — E1159 Type 2 diabetes mellitus with other circulatory complications: Secondary | ICD-10-CM

## 2022-07-23 MED ORDER — LISINOPRIL-HYDROCHLOROTHIAZIDE 20-12.5 MG PO TABS: 1.0000 | ORAL_TABLET | Freq: Every day | ORAL | 0 refills | Status: DC

## 2022-07-23 NOTE — Telephone Encounter (Signed)
Pt called and left VM for Zestoretic [id number] - [place], [place] [address]

## 2022-07-23 NOTE — Telephone Encounter (Signed)
I have refilled for 30 days but pt needs to schedule an appt per Dr. Tomi Bamberger last office note to follow-up in 6 months. Please scheduel

## 2022-07-24 ENCOUNTER — Other Ambulatory Visit: Payer: Self-pay | Admitting: Physician Assistant

## 2022-07-24 DIAGNOSIS — E119 Type 2 diabetes mellitus without complications: Secondary | ICD-10-CM

## 2022-08-20 ENCOUNTER — Other Ambulatory Visit: Payer: Self-pay | Admitting: Family Medicine

## 2022-08-20 DIAGNOSIS — I152 Hypertension secondary to endocrine disorders: Secondary | ICD-10-CM

## 2022-08-21 ENCOUNTER — Other Ambulatory Visit: Payer: Self-pay | Admitting: Nurse Practitioner

## 2022-08-21 DIAGNOSIS — I152 Hypertension secondary to endocrine disorders: Secondary | ICD-10-CM

## 2022-09-11 ENCOUNTER — Telehealth: Payer: Self-pay | Admitting: Nurse Practitioner

## 2022-09-11 NOTE — Telephone Encounter (Signed)
Marco Campbell called and says he thinks he is having a reaction to lisinopril. His lips are swelling, hives, and throat feeling funny. He is asking if this medication can be changed or will he need to come in for an appointment?

## 2022-09-13 ENCOUNTER — Other Ambulatory Visit: Payer: Self-pay

## 2022-09-13 MED ORDER — AMLODIPINE BESYLATE 5 MG PO TABS: 5.0000 mg | ORAL_TABLET | Freq: Every day | ORAL | 0 refills | Status: DC

## 2022-10-09 ENCOUNTER — Telehealth: Payer: Self-pay | Admitting: Nurse Practitioner

## 2022-10-09 DIAGNOSIS — E119 Type 2 diabetes mellitus without complications: Secondary | ICD-10-CM

## 2022-10-09 NOTE — Telephone Encounter (Signed)
Pt called and is requesting a refill on his Farxiga Please [id number] - [place], [place] - [address]     Pt also states that ever since he started taking the Norvasc his BP has went up some went up to the mid 140's over 70 to 80's  and when he was taking the over rx it was in the lower 120/60 to 70's States he has also been having some on and again headaches,   Pt wants to know if he needs to change his medicine

## 2022-10-10 ENCOUNTER — Other Ambulatory Visit: Payer: Self-pay

## 2022-10-10 DIAGNOSIS — E119 Type 2 diabetes mellitus without complications: Secondary | ICD-10-CM

## 2022-10-10 MED ORDER — DAPAGLIFLOZIN PROPANEDIOL 5 MG PO TABS: ORAL_TABLET | ORAL | 0 refills | Status: DC

## 2022-10-10 NOTE — Telephone Encounter (Signed)
I have sent in a 30 day refill on Farxiga.   Unfortunately, I cannot safely make changes to his medications without him being seen.  Last visit was 10/30 with Dr. Lynelle Doctor with recommendations for 6 month follow-up with me for CPE.  Nothing scheduled.    We will have to wait until he is seen and we can get labs to determine what blood pressure medications are safe to transition to. We must also get updated labs in order to continue the Farxiga, as this medication must have kidney monitoring every 6 months.   If he is unable to get in for CPE, please schedule for Chronic F/U as soon as possible.

## 2022-10-20 ENCOUNTER — Other Ambulatory Visit: Payer: Self-pay | Admitting: Nurse Practitioner

## 2022-10-20 DIAGNOSIS — E1169 Type 2 diabetes mellitus with other specified complication: Secondary | ICD-10-CM

## 2022-10-23 ENCOUNTER — Ambulatory Visit: Payer: BC Managed Care – PPO | Admitting: Nurse Practitioner

## 2022-10-23 ENCOUNTER — Encounter: Payer: Self-pay | Admitting: Nurse Practitioner

## 2022-10-23 VITALS — BP 138/68 | HR 75 | Temp 98.1°F | Resp 16 | Wt 252.0 lb

## 2022-10-23 DIAGNOSIS — R7989 Other specified abnormal findings of blood chemistry: Secondary | ICD-10-CM | POA: Diagnosis not present

## 2022-10-23 DIAGNOSIS — I152 Hypertension secondary to endocrine disorders: Secondary | ICD-10-CM

## 2022-10-23 DIAGNOSIS — E1169 Type 2 diabetes mellitus with other specified complication: Secondary | ICD-10-CM

## 2022-10-23 DIAGNOSIS — I10 Essential (primary) hypertension: Secondary | ICD-10-CM

## 2022-10-23 DIAGNOSIS — E785 Hyperlipidemia, unspecified: Secondary | ICD-10-CM

## 2022-10-23 DIAGNOSIS — E119 Type 2 diabetes mellitus without complications: Secondary | ICD-10-CM

## 2022-10-23 DIAGNOSIS — E1159 Type 2 diabetes mellitus with other circulatory complications: Secondary | ICD-10-CM

## 2022-10-23 DIAGNOSIS — E559 Vitamin D deficiency, unspecified: Secondary | ICD-10-CM

## 2022-10-23 DIAGNOSIS — F17200 Nicotine dependence, unspecified, uncomplicated: Secondary | ICD-10-CM

## 2022-10-23 DIAGNOSIS — J0140 Acute pansinusitis, unspecified: Secondary | ICD-10-CM

## 2022-10-23 LAB — HEMOGLOBIN A1C: Hgb A1c MFr Bld: 5.9 % — ABNORMAL HIGH (ref 4.8–5.6)

## 2022-10-23 LAB — CBC WITH DIFFERENTIAL/PLATELET
Eos: 2 %
Immature Grans (Abs): 0 10*3/uL (ref 0.0–0.1)
Immature Granulocytes: 0 %
MCH: 29.4 pg (ref 26.6–33.0)
MCV: 89 fL (ref 79–97)
Monocytes Absolute: 0.8 10*3/uL (ref 0.1–0.9)
Monocytes: 10 %
Neutrophils: 63 %
RDW: 12.7 % (ref 11.6–15.4)

## 2022-10-23 LAB — COMPREHENSIVE METABOLIC PANEL
AST: 21 IU/L (ref 0–40)
Bilirubin Total: 0.2 mg/dL (ref 0.0–1.2)
Calcium: 9.2 mg/dL (ref 8.7–10.2)
Glucose: 109 mg/dL — ABNORMAL HIGH (ref 70–99)
Potassium: 3.9 mmol/L (ref 3.5–5.2)
Total Protein: 6.6 g/dL (ref 6.0–8.5)
eGFR: 76 mL/min/{1.73_m2} (ref >59)

## 2022-10-23 MED ORDER — HYDROCHLOROTHIAZIDE 25 MG PO TABS
25.0000 mg | ORAL_TABLET | Freq: Every day | ORAL | 1 refills | Status: DC
Start: 2022-10-23 — End: 2022-11-02

## 2022-10-23 MED ORDER — AMLODIPINE BESYLATE 5 MG PO TABS
5.0000 mg | ORAL_TABLET | Freq: Every day | ORAL | 1 refills | Status: DC
Start: 2022-10-23 — End: 2023-04-26

## 2022-10-23 MED ORDER — AMOXICILLIN-POT CLAVULANATE 875-125 MG PO TABS
1.0000 | ORAL_TABLET | Freq: Two times a day (BID) | ORAL | 0 refills | Status: DC
Start: 2022-10-23 — End: 2023-02-08

## 2022-10-23 MED ORDER — ATORVASTATIN CALCIUM 20 MG PO TABS: 20.0000 mg | ORAL_TABLET | Freq: Every day | ORAL | 1 refills | Status: DC

## 2022-10-23 MED ORDER — DAPAGLIFLOZIN PROPANEDIOL 5 MG PO TABS: ORAL_TABLET | ORAL | 1 refills | Status: DC

## 2022-10-23 NOTE — Progress Notes (Signed)
Marco Clamp, DNP, AGNP-c [facility]  [address] [place], [place] [address] [phone]  ESTABLISHED PATIENT- Chronic Health and/or Follow-Up Visit  Blood pressure 138/68, pulse 75, temperature 98.1 F (36.7 C), temperature source Oral, resp. rate 16, weight 252 lb (114.3 kg), SpO2 95%.    Marco Campbell is a 57 y.o. year old male presenting today for evaluation and management of chronic conditions.   Headache [name] presents today with slight headache and right ear pressure. This has been present for the last few days.  If exam shows effusion present in the right ear with drainage noted in the posterior oropharynx.  Sinus tenderness is also present.  HTN John is currently managed with amlodipine.  He does have an allergy to lisinopril.  Blood pressure at home running in the 140's/70's.  Headache in the office today is slightly elevated.  Patient is still smoking.  We discussed recommendation for smoking cessation.  All ROS negative with exception of what is listed above.   PHYSICAL EXAM Physical Exam Vitals and nursing note reviewed.  Constitutional:      General: He is not in acute distress.    Appearance: Normal appearance.  HENT:     Right Ear: Tenderness present. A middle ear effusion is present.     Left Ear: Hearing, tympanic membrane, ear canal and external ear normal.  No middle ear effusion.     Mouth/Throat:     Mouth: Mucous membranes are moist.     Pharynx: Uvula midline. Oropharyngeal exudate present. No pharyngeal swelling.  Neck:     Vascular: No carotid bruit.  Cardiovascular:     Rate and Rhythm: Normal rate and regular rhythm.     Pulses: Normal pulses.     Heart sounds: Normal heart sounds.  Pulmonary:     Effort: Pulmonary effort is normal.     Breath sounds: Normal breath sounds. No wheezing.  Musculoskeletal:     Cervical back: Normal range of motion.  Lymphadenopathy:     Cervical: Cervical adenopathy present.   Skin:    General: Skin is warm and dry.  Neurological:     Mental Status: He is alert and oriented to person, place, and time.  Psychiatric:        Behavior: Behavior normal.     PLAN Problem List Items Addressed This Visit     Hypertension associated with diabetes (HCC) - Primary    Chronic hypertension not well-controlled at this time.  Blood pressures at home in the 140s/70s.  There is an elevation in blood pressure today.  He is currently on amlodipine 5 mg.  Of note he does have an allergy to lisinopril.  Mild headache is present, unclear if this is related to elevation in his blood pressure or suspected otitis media/sinus infection. Plan: Add hydrochlorothiazide to amlodipine Monitor blood pressures at home and report any readings greater than 130/80 that are consistently occurring. Labs today Follow-up in 3 months.      Relevant Medications   amLODipine (NORVASC) 5 MG tablet   dapagliflozin propanediol (FARXIGA) 5 MG TABS tablet   atorvastatin (LIPITOR) 20 MG tablet   Other Relevant Orders   Hemoglobin A1c (Completed)   CBC with Differential/Platelet (Completed)   Comprehensive metabolic panel (Completed)   VITAMIN D 25 Hydroxy (Vit-D Deficiency, Fractures) (Completed)   POCT UA - Microalbumin   Smoker    Smoking cessation recommended for overall health. We can discuss medication options that may be helpful in depth when the patient  is ready to quit.       Controlled type 2 diabetes mellitus without complication, without long-term current use of insulin (HCC)    Currently on Farxiga. Will monitor labs today. Unclear of home blood sugars. Previous A1c's have been in good control. In the setting of HTN, >BMI, and smoking good control and close monitoring recommended.  Plan: - Follow low carb, low fat, high protein, high fiber diet - Exercise regularly, starting slow and increasing as tolerated with goal of 150 minutes a week - Follow-up in 6 months      Relevant  Medications   amLODipine (NORVASC) 5 MG tablet   dapagliflozin propanediol (FARXIGA) 5 MG TABS tablet   atorvastatin (LIPITOR) 20 MG tablet   Other Relevant Orders   Hemoglobin A1c (Completed)   CBC with Differential/Platelet (Completed)   Comprehensive metabolic panel (Completed)   VITAMIN D 25 Hydroxy (Vit-D Deficiency, Fractures) (Completed)   POCT UA - Microalbumin   Elevated serum creatinine    History of elevated creatinine in the setting of HTN, DM. Will monitor today. Patient is on [place].  Plan: - Labs pending - Continue Farxiga and close blood pressure monitoring.       Relevant Medications   amLODipine (NORVASC) 5 MG tablet   dapagliflozin propanediol (FARXIGA) 5 MG TABS tablet   atorvastatin (LIPITOR) 20 MG tablet   Other Relevant Orders   Hemoglobin A1c (Completed)   CBC with Differential/Platelet (Completed)   Comprehensive metabolic panel (Completed)   VITAMIN D 25 Hydroxy (Vit-D Deficiency, Fractures) (Completed)   POCT UA - Microalbumin   Vitamin D deficiency    Repeat labs today      Relevant Medications   atorvastatin (LIPITOR) 20 MG tablet   Other Relevant Orders   Hemoglobin A1c (Completed)   CBC with Differential/Platelet (Completed)   Comprehensive metabolic panel (Completed)   VITAMIN D 25 Hydroxy (Vit-D Deficiency, Fractures) (Completed)   POCT UA - Microalbumin   Acute non-recurrent pansinusitis    Symptoms and presentation consistent with sinusitis and right ear involvement.  Will send in treatment with Augmentin. Plan: Complete antibiotic as prescribed If symptoms are not resolved please let me know.      Relevant Medications   amoxicillin-clavulanate (AUGMENTIN) 875-125 MG tablet   Hyperlipidemia associated with type 2 diabetes mellitus (HCC)   Relevant Medications   amLODipine (NORVASC) 5 MG tablet   dapagliflozin propanediol (FARXIGA) 5 MG TABS tablet   atorvastatin (LIPITOR) 20 MG tablet   Other Relevant Orders   Hemoglobin A1c  (Completed)   CBC with Differential/Platelet (Completed)   Comprehensive metabolic panel (Completed)   VITAMIN D 25 Hydroxy (Vit-D Deficiency, Fractures) (Completed)   POCT UA - Microalbumin    Return in about 6 months (around 04/25/2023) for CPE.  Time: 32 minutes, >50% spent counseling, care coordination, chart review, and documentation.   Marco Clamp, DNP, AGNP-c

## 2022-10-23 NOTE — Patient Instructions (Signed)
I have sent in antibiotics for you for the sinus infection.   I have sent in a new medication called hydrochlorothiazide to add to your amlodipine to see if we can get your blood pressure under better control.   If you notice your BP is staying higher than 130/80 on average, please let me know and we can always make further adjustments.

## 2022-10-24 LAB — CBC WITH DIFFERENTIAL/PLATELET
Basophils Absolute: 0 10*3/uL (ref 0.0–0.2)
Basos: 0 %
EOS (ABSOLUTE): 0.2 10*3/uL (ref 0.0–0.4)
Hematocrit: 44.7 % (ref 37.5–51.0)
Hemoglobin: 14.7 g/dL (ref 13.0–17.7)
Lymphocytes Absolute: 2.1 10*3/uL (ref 0.7–3.1)
Lymphs: 25 %
MCHC: 32.9 g/dL (ref 31.5–35.7)
Neutrophils Absolute: 5.2 10*3/uL (ref 1.4–7.0)
Platelets: 196 10*3/uL (ref 150–450)
RBC: 5 x10E6/uL (ref 4.14–5.80)
WBC: 8.4 10*3/uL (ref 3.4–10.8)

## 2022-10-24 LAB — COMPREHENSIVE METABOLIC PANEL
ALT: 24 IU/L (ref 0–44)
Albumin: 3.9 g/dL (ref 3.8–4.9)
Alkaline Phosphatase: 75 IU/L (ref 44–121)
BUN/Creatinine Ratio: 15 (ref 9–20)
BUN: 17 mg/dL (ref 6–24)
CO2: 22 mmol/L (ref 20–29)
Chloride: 108 mmol/L — ABNORMAL HIGH (ref 96–106)
Creatinine, Ser: 1.13 mg/dL (ref 0.76–1.27)
Globulin, Total: 2.7 g/dL (ref 1.5–4.5)
Sodium: 143 mmol/L (ref 134–144)

## 2022-10-24 LAB — VITAMIN D 25 HYDROXY (VIT D DEFICIENCY, FRACTURES): Vit D, 25-Hydroxy: 56.6 ng/mL (ref 30.0–100.0)

## 2022-10-24 LAB — HEMOGLOBIN A1C: Est. average glucose Bld gHb Est-mCnc: 123 mg/dL

## 2022-10-30 ENCOUNTER — Telehealth: Payer: Self-pay | Admitting: Nurse Practitioner

## 2022-10-30 NOTE — Telephone Encounter (Signed)
Marco Campbell called and states the medication he was put on at his last visit broke him out and he has stopped taking it. Wants to know if there are any other suggestions.

## 2022-11-02 ENCOUNTER — Other Ambulatory Visit: Payer: Self-pay

## 2022-11-02 MED ORDER — VALSARTAN 80 MG PO TABS: 80.0000 mg | ORAL_TABLET | Freq: Every day | ORAL | 1 refills | Status: DC

## 2022-12-03 DIAGNOSIS — J0140 Acute pansinusitis, unspecified: Secondary | ICD-10-CM | POA: Insufficient documentation

## 2022-12-03 HISTORY — DX: Acute pansinusitis, unspecified: J01.40

## 2022-12-03 NOTE — Assessment & Plan Note (Signed)
Currently on Farxiga. Will monitor labs today. Unclear of home blood sugars. Previous A1c's have been in good control. In the setting of HTN, >BMI, and smoking good control and close monitoring recommended.  Plan: - Follow low carb, low fat, high protein, high fiber diet - Exercise regularly, starting slow and increasing as tolerated with goal of 150 minutes a week - Follow-up in 6 months

## 2022-12-03 NOTE — Assessment & Plan Note (Signed)
Symptoms and presentation consistent with sinusitis and right ear involvement.  Will send in treatment with Augmentin. Plan: Complete antibiotic as prescribed If symptoms are not resolved please let me know.

## 2022-12-03 NOTE — Assessment & Plan Note (Signed)
History of elevated creatinine in the setting of HTN, DM. Will monitor today. Patient is on [place].  Plan: - Labs pending - Continue Farxiga and close blood pressure monitoring.

## 2022-12-03 NOTE — Assessment & Plan Note (Signed)
Smoking cessation recommended for overall health. We can discuss medication options that may be helpful in depth when the patient is ready to quit.

## 2022-12-03 NOTE — Assessment & Plan Note (Signed)
Repeat labs today

## 2022-12-03 NOTE — Assessment & Plan Note (Signed)
Chronic hypertension not well-controlled at this time.  Blood pressures at home in the 140s/70s.  There is an elevation in blood pressure today.  He is currently on amlodipine 5 mg.  Of note he does have an allergy to lisinopril.  Mild headache is present, unclear if this is related to elevation in his blood pressure or suspected otitis media/sinus infection. Plan: Add hydrochlorothiazide to amlodipine Monitor blood pressures at home and report any readings greater than 130/80 that are consistently occurring. Labs today Follow-up in 3 months.

## 2022-12-25 ENCOUNTER — Telehealth: Payer: Self-pay | Admitting: Nurse Practitioner

## 2022-12-25 DIAGNOSIS — J329 Chronic sinusitis, unspecified: Secondary | ICD-10-CM

## 2022-12-25 NOTE — Telephone Encounter (Signed)
Referral sent to ENT for the recurrent infections

## 2022-12-25 NOTE — Telephone Encounter (Signed)
Pt states sinus infection has come back. He states he feels like it did clear up but has now come back Wanting to see if you can call in rx

## 2022-12-25 NOTE — Telephone Encounter (Signed)
Pt called back and states that the sinus infections seem to keep coming back and wants to know if he can go to a ENT

## 2023-02-08 ENCOUNTER — Encounter (INDEPENDENT_AMBULATORY_CARE_PROVIDER_SITE_OTHER): Payer: Self-pay | Admitting: Otolaryngology

## 2023-02-08 ENCOUNTER — Telehealth: Payer: Self-pay | Admitting: Otolaryngology

## 2023-02-08 ENCOUNTER — Ambulatory Visit (INDEPENDENT_AMBULATORY_CARE_PROVIDER_SITE_OTHER): Payer: BC Managed Care – PPO | Admitting: Otolaryngology

## 2023-02-08 VITALS — BP 147/75 | HR 67 | Ht 73.0 in | Wt 246.0 lb

## 2023-02-08 DIAGNOSIS — R0982 Postnasal drip: Secondary | ICD-10-CM

## 2023-02-08 DIAGNOSIS — J3489 Other specified disorders of nose and nasal sinuses: Secondary | ICD-10-CM | POA: Diagnosis not present

## 2023-02-08 DIAGNOSIS — J3089 Other allergic rhinitis: Secondary | ICD-10-CM

## 2023-02-08 DIAGNOSIS — J343 Hypertrophy of nasal turbinates: Secondary | ICD-10-CM

## 2023-02-08 DIAGNOSIS — J329 Chronic sinusitis, unspecified: Secondary | ICD-10-CM | POA: Diagnosis not present

## 2023-02-08 DIAGNOSIS — J342 Deviated nasal septum: Secondary | ICD-10-CM

## 2023-02-08 DIAGNOSIS — R0981 Nasal congestion: Secondary | ICD-10-CM

## 2023-02-08 MED ORDER — CETIRIZINE HCL 10 MG PO TABS: 10.0000 mg | ORAL_TABLET | Freq: Every day | ORAL | 11 refills | Status: AC

## 2023-02-08 MED ORDER — FLUTICASONE PROPIONATE 50 MCG/ACT NA SUSP: 2.0000 | Freq: Two times a day (BID) | NASAL | 6 refills | Status: DC

## 2023-02-08 NOTE — Patient Instructions (Signed)
-   start Flonase and Zyrtec - schedule CT sinuses - return after imaging

## 2023-02-08 NOTE — Progress Notes (Unsigned)
ENT CONSULT:  Reason for Consult: recurrent vs chronic sinusitis    HPI: Marco Campbell is an 57 y.o. male with hx T2DM, environmental allergies, here for recurrent vs chronic sinus infection. He had Augmentin for a recent infection 2 months ago, had relief, but then after he finished, his sx returned. He reports chronic nasal congestion, frequently blows his nose, nasal drainage, mostly clear, ear fullness, frontal headache. He takes NSAIDs and uses saline spray for his sx. He has good sense of smell. No prior surgeries nasal/sinus.   Records Reviewed:  ***    Past Medical History:  Diagnosis Date   AKI (acute kidney injury) (HCC) 07/09/2016   Allergy    Arthritis    Blurry vision 07/09/2016   Controlled type 2 diabetes mellitus without complication, without long-term current use of insulin (HCC) 04/09/2017   Diabetes mellitus without complication (HCC)    Elevated LDL cholesterol level 03/05/2017   Hyperlipidemia    Hypertension    Oral thrush 07/09/2016   Sleep apnea    Does not use CPAP   SOB (shortness of breath) 11/07/2017    Past Surgical History:  Procedure Laterality Date   ROTATOR CUFF REPAIR Right 11/2021   Dr. Ave Filter    Family History  Problem Relation Age of Onset   Diabetes Mother    Heart failure Mother        Died age 92   Diabetes Father    Heart disease Father    Heart attack Father 62       CABG twice.  Died age 11   Hypertension Brother    Diabetes Brother    Heart failure Brother 31   Diabetes Maternal Grandmother    Diabetes Sister    Heart failure Sister 80   Colon cancer Neg Hx    Esophageal cancer Neg Hx    Rectal cancer Neg Hx    Stomach cancer Neg Hx     Social History:  reports that he has been smoking cigarettes. He has never used smokeless tobacco. He reports current alcohol use. He reports that he does not use drugs.  Allergies:  Allergies  Allergen Reactions   Bee Venom Hives   Metformin And Related Diarrhea    Tried XR  as well     Medications: I have reviewed the patient's current medications.  The PMH, PSH, Medications, Allergies, and SH were reviewed and updated.  ROS: Constitutional: Negative for fever, weight loss and weight gain. Cardiovascular: Negative for chest pain and dyspnea on exertion. Respiratory: Is not experiencing shortness of breath at rest. Gastrointestinal: Negative for nausea and vomiting. Neurological: Negative for headaches. Psychiatric: The patient is not nervous/anxious  Blood pressure (!) 147/75, pulse 67, height 6\' 1"  (1.854 m), weight 246 lb (111.6 kg), SpO2 99%.  PHYSICAL EXAM:  Exam: General: Well-developed, well-nourished Communication and Voice: Clear pitch and clarity Respiratory Respiratory effort: Equal inspiration and expiration without stridor Cardiovascular Peripheral Vascular: Warm extremities with equal color/perfusion Eyes: No nystagmus with equal extraocular motion bilaterally Neuro/Psych/Balance: Patient oriented to person, place, and time; Appropriate mood and affect; Gait is intact with no imbalance; Cranial nerves I-XII are intact Head and Face Inspection: Normocephalic and atraumatic without mass or lesion Palpation: Facial skeleton intact without bony stepoffs Salivary Glands: No mass or tenderness Facial Strength: Facial motility symmetric and full bilaterally ENT Pinna: External ear intact and fully developed External canal: Canal is patent with intact skin Tympanic Membrane: Clear and mobile External Nose: No scar or anatomic  deformity Internal Nose: Septum is ***. No polyp, or purulence. Mucosal edema and erythema present.  Bilateral inferior turbinate hypertrophy.  Lips, Teeth, and gums: Mucosa and teeth intact and viable TMJ: No pain to palpation with full mobility Oral cavity/oropharynx: No erythema or exudate, no lesions present Nasopharynx: No mass or lesion with intact mucosa Hypopharynx: Intact mucosa without pooling of  secretions Larynx Glottic: Full true vocal cord mobility without lesion or mass Supraglottic: Normal appearing epiglottis and AE folds Interarytenoid Space: No or minimal pachydermia or edema Subglottic Space: Patent without lesion or edema Neck Neck and Trachea: Midline trachea without mass or lesion Thyroid: No mass or nodularity Lymphatics: No lymphadenopathy  Procedure:      Studies Reviewed:***  Assessment/Plan: No diagnosis found.  ***  Thank you for allowing me to participate in the care of this patient. Please do not hesitate to contact me with any questions or concerns.   Ashok Croon, MD Otolaryngology [facility] Phone: [phone] Fax: [phone]    02/08/2023, 2:34 PM

## 2023-02-08 NOTE — Telephone Encounter (Signed)
Pt needed phone number for Imaging. He says he called and they do not do the type of scan that he was asking. The phone number I gave him was [phone].

## 2023-02-11 NOTE — Telephone Encounter (Signed)
He can also call this number [phone]. This is the phone number for centralized radiology scheduling. They will know which facility is capable of the requested CT. Thanks

## 2023-02-14 ENCOUNTER — Institutional Professional Consult (permissible substitution) (INDEPENDENT_AMBULATORY_CARE_PROVIDER_SITE_OTHER): Payer: BC Managed Care – PPO | Admitting: Otolaryngology

## 2023-02-26 ENCOUNTER — Ambulatory Visit (HOSPITAL_COMMUNITY)
Admission: RE | Admit: 2023-02-26 | Discharge: 2023-02-26 | Disposition: A | Discharge status: Home / Self Care | Payer: BC Managed Care – PPO | Source: Ambulatory Visit | Attending: Otolaryngology | Admitting: Otolaryngology

## 2023-02-26 DIAGNOSIS — J329 Chronic sinusitis, unspecified: Secondary | ICD-10-CM | POA: Diagnosis present

## 2023-03-25 ENCOUNTER — Encounter (INDEPENDENT_AMBULATORY_CARE_PROVIDER_SITE_OTHER): Payer: Self-pay | Admitting: Otolaryngology

## 2023-03-25 ENCOUNTER — Ambulatory Visit (INDEPENDENT_AMBULATORY_CARE_PROVIDER_SITE_OTHER): Payer: BC Managed Care – PPO | Admitting: Otolaryngology

## 2023-03-25 VITALS — BP 129/76 | HR 79

## 2023-03-25 DIAGNOSIS — R0982 Postnasal drip: Secondary | ICD-10-CM

## 2023-03-25 DIAGNOSIS — R519 Headache, unspecified: Secondary | ICD-10-CM

## 2023-03-25 DIAGNOSIS — J343 Hypertrophy of nasal turbinates: Secondary | ICD-10-CM | POA: Diagnosis not present

## 2023-03-25 DIAGNOSIS — J342 Deviated nasal septum: Secondary | ICD-10-CM

## 2023-03-25 DIAGNOSIS — R0981 Nasal congestion: Secondary | ICD-10-CM | POA: Diagnosis not present

## 2023-03-25 DIAGNOSIS — J3089 Other allergic rhinitis: Secondary | ICD-10-CM

## 2023-03-25 DIAGNOSIS — J3489 Other specified disorders of nose and nasal sinuses: Secondary | ICD-10-CM

## 2023-03-25 NOTE — Patient Instructions (Signed)
Lloyd [facility] Med Nasal Saline Rinse   - start nasal saline rinses with [employer]le available over the counter or online to help with nasal congestion

## 2023-03-25 NOTE — Progress Notes (Unsigned)
ENT Progress Note:  Update 03/25/23:  Discussed the use of AI scribe software for clinical note transcription with the patient, who gave verbal consent to proceed.  History of Present Illness   The patient is a 57 yoM, with a history of sinus infection symptoms and nasal congestion, presents for a review of recent imaging results (CT sinuses) and to check on his sx. He reports a slight improvement in symptoms, but continues to experience pressure on both sides of the face. The patient has been on Zyrtec and Flonase, with Sudafed taken as needed during severe episodes.  The patient also reports headaches, particularly during weather changes. The patient has a history of sleep apnea and has previously used a CPAP machine, which he found difficult to tolerate.   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 He has recently completed a course of Augmentin, prescribed by his primary care physician for his symptoms.  No prior allergy testing.     Initial Consultation 02/08/23 Reason for Consult: recurrent vs chronic sinusitis    HPI: Marco Campbell is an 57 y.o. male with hx HTN, T2DM, environmental allergies, here for recurrent vs chronic sinus infection. He had Augmentin for a recent infection 2 months ago, had relief, but then after he finished, his sx returned. He reports chronic nasal congestion, frequently blows his nose, nasal drainage, mostly clear, ear fullness, frontal headache. He takes NSAIDs and uses saline spray for his sx. He has good sense of smell. No prior surgeries nasal/sinus.  Reports facial pain and pressure.    Past Medical History:  Diagnosis Date   AKI (acute kidney injury) (HCC) 07/09/2016   Allergy    Arthritis    Blurry vision 07/09/2016   Controlled type 2 diabetes mellitus without complication, without long-term current use of insulin (HCC)  04/09/2017   Diabetes mellitus without complication (HCC)    Elevated LDL cholesterol level 03/05/2017   Hyperlipidemia    Hypertension    Oral thrush 07/09/2016   Sleep apnea    Does not use CPAP   SOB (shortness of breath) 11/07/2017    Past Surgical History:  Procedure Laterality Date   ROTATOR CUFF REPAIR Right 11/2021   Dr. Ave Filter    Family History  Problem Relation Age of Onset   Diabetes Mother    Heart failure Mother        Died age 37   Diabetes Father    Heart disease Father    Heart attack Father 34       CABG twice.  Died age 37   Hypertension Brother    Diabetes Brother    Heart failure Brother 40   Diabetes Maternal Grandmother    Diabetes Sister    Heart failure Sister 92   Colon cancer Neg Hx    Esophageal cancer Neg Hx    Rectal cancer Neg Hx    Stomach cancer Neg Hx     Social History:  reports that he has been smoking cigarettes. He has never used smokeless tobacco. He reports current alcohol use. He reports that he does not use drugs.  Allergies:  Allergies  Allergen Reactions   Bee Venom Hives   Metformin And Related Diarrhea    Tried XR as well     Medications: I have reviewed the patient's current medications.  The PMH, PSH, Medications, Allergies, and SH were reviewed  and updated.  ROS: Constitutional: Negative for fever, weight loss and weight gain. Cardiovascular: Negative for chest pain and dyspnea on exertion. Respiratory: Is not experiencing shortness of breath at rest. Gastrointestinal: Negative for nausea and vomiting. Neurological: Negative for headaches. Psychiatric: The patient is not nervous/anxious  Blood pressure 129/76, pulse 79, SpO2 95%.  PHYSICAL EXAM:  Exam: General: Well-developed, well-nourished Respiratory Respiratory effort: Equal inspiration and expiration without stridor Cardiovascular Peripheral Vascular: Warm extremities with equal color/perfusion Eyes: No nystagmus with equal extraocular motion  bilaterally Neuro/Psych/Balance: Patient oriented to person, place, and time; Appropriate mood and affect; Gait is intact with no imbalance; Cranial nerves I-XII are intact Head and Face Inspection: Normocephalic and atraumatic without mass or lesion Palpation: Facial skeleton intact without bony stepoffs Salivary Glands: No mass or tenderness Facial Strength: Facial motility symmetric and full bilaterally ENT Pinna: External ear intact and fully developed External canal: Canal is patent with intact skin Tympanic Membrane: Clear and mobile External Nose: No scar or anatomic deformity Internal Nose: Septum is deviated to the left on anterior rhinoscopy. No polyp, or purulence. Mucosal edema and erythema present.  Bilateral inferior turbinate hypertrophy.  Lips, Teeth, and gums: Mucosa and teeth intact and viable Oral cavity/oropharynx: No erythema or exudate, no lesions present Neck Neck and Trachea: Midline trachea without mass or lesion Thyroid: No mass or nodularity Lymphatics: No lymphadenopathy  Studies Reviewed:   CT sinuses 02/26/23 Clear paranasal sinuses on my imaging review  IMPRESSION: Normally aerated paranasal sinuses.  Patent sinus drainage pathways.    Assessment/Plan: Encounter Diagnoses  Name Primary?   Chronic nasal congestion Yes   Nasal obstruction    Nasal septal deviation    Hypertrophy of both inferior nasal turbinates    Environmental and seasonal allergies    Post-nasal drip    Headache around the eyes      Recurrent versus chronic sinusitis based on clinical history.  Nasal endoscopy without evidence of purulence or masses/lesions.  Had evidence of septal deviation/ turbinate hypertrophy and significant mucosal edema.  Suspect element of environmental allergies.  -CT sinuses to rule out chronic sinus inflammation -Trial of Flonase 2 puffs b/l nares BID and Zyrtec 10 mg daily  - RTC after imaging   2.  Septal deviation inferior turbinate  hypertrophy and chronic nasal obstruction -Medical management is involved -If fails maximal medical management will consider septoplasty inferior turbinate reduction  3.  Environmental allergies -Medical management as above -Will consider allergy testing in the future  Update 03/25/23 Assessment and Plan    Chronic Nasal Congestion Bilateral facial pressure and nasal congestion. Imaging shows mild inferior turbinate hypertrophy and slight septal deviation, no chronic sinus disease. Likely due to allergic rhinitis and nasal blockage. Discussed allergens (dust, mold, pollen) and potential benefits of allergy testing. Empiric management with allergy medications and nasal sprays recommended. Explained nasal saline rinses help with sinus infections and allergen removal. No chronic sinus disease requiring surgery. Potential future surgical options if symptoms persist: turbinate reduction, septoplasty. - Continue Zyrtec 10 mg daily - Continue Flonase nasal spray daily 2 puffs b/l nares BID - Use nasal saline rinses regularly - Take Sudafed as needed for severe episodes or weather-related headaches - Consider allergy testing if symptoms persist - would like to defer at this time  Aerosinusitis Headaches correlated with weather changes, likely due to pressure differentials in the sinuses, exacerbated by nasal congestion and negative CT sinuses without CRS. Management focuses on treating nasal congestion. Discussed that headaches can be due to  nasal congestion or other reasons. Aerosinusitis more common in pilots or with rapid pressure changes. Sudafed and occasionally steroids or prophylactic antibiotics can help, but steroids not recommended for chronic use due to side effects. - Continue Zyrtec daily 10 mg daily - Continue Flonase nasal spray daily 2 puffs b/l nares BID - Use Sudafed as needed for severe episodes or weather-related headaches   Sleep Apnea reportedly mild, not on CPAP Discussed  potential impact of CPAP on nasal congestion and importance of weight management. Difficulty tolerating CPAP, not currently using it. Weight loss can sometimes alleviate mild sleep apnea. Potential future interventions if CPAP is reintroduced and poorly tolerated: septoplasty, tonsillectomy ( he has 2+ to 3+ tonsillar hypertrophy on exam) - Monitor weight and consider weight loss to potentially alleviate mild sleep apnea - Consider future interventions such as septoplasty or tonsillectomy if CPAP is reintroduced and poorly tolerated  Follow-up - Schedule follow-up in six months or call when ready for further evaluation.        Ashok Croon, MD Otolaryngology [facility] Phone: [phone] Fax: [phone]    03/26/2023, 8:10 AM

## 2023-04-16 ENCOUNTER — Other Ambulatory Visit: Payer: Self-pay | Admitting: Nurse Practitioner

## 2023-04-16 DIAGNOSIS — I152 Hypertension secondary to endocrine disorders: Secondary | ICD-10-CM

## 2023-04-16 DIAGNOSIS — E119 Type 2 diabetes mellitus without complications: Secondary | ICD-10-CM

## 2023-04-23 ENCOUNTER — Other Ambulatory Visit: Payer: Self-pay | Admitting: Nurse Practitioner

## 2023-04-26 ENCOUNTER — Encounter: Payer: Self-pay | Admitting: Nurse Practitioner

## 2023-04-26 ENCOUNTER — Ambulatory Visit: Payer: 59 | Admitting: Nurse Practitioner

## 2023-04-26 VITALS — BP 122/86 | HR 83 | Ht 73.0 in | Wt 249.8 lb

## 2023-04-26 DIAGNOSIS — E785 Hyperlipidemia, unspecified: Secondary | ICD-10-CM

## 2023-04-26 DIAGNOSIS — J3089 Other allergic rhinitis: Secondary | ICD-10-CM

## 2023-04-26 DIAGNOSIS — E1159 Type 2 diabetes mellitus with other circulatory complications: Secondary | ICD-10-CM

## 2023-04-26 DIAGNOSIS — Z23 Encounter for immunization: Secondary | ICD-10-CM | POA: Diagnosis not present

## 2023-04-26 DIAGNOSIS — M25562 Pain in left knee: Secondary | ICD-10-CM

## 2023-04-26 DIAGNOSIS — Z0000 Encounter for general adult medical examination without abnormal findings: Secondary | ICD-10-CM

## 2023-04-26 DIAGNOSIS — E119 Type 2 diabetes mellitus without complications: Secondary | ICD-10-CM | POA: Diagnosis not present

## 2023-04-26 DIAGNOSIS — E559 Vitamin D deficiency, unspecified: Secondary | ICD-10-CM

## 2023-04-26 DIAGNOSIS — R7989 Other specified abnormal findings of blood chemistry: Secondary | ICD-10-CM

## 2023-04-26 DIAGNOSIS — I152 Hypertension secondary to endocrine disorders: Secondary | ICD-10-CM

## 2023-04-26 DIAGNOSIS — E1169 Type 2 diabetes mellitus with other specified complication: Secondary | ICD-10-CM

## 2023-04-26 LAB — LIPID PANEL

## 2023-04-26 MED ORDER — DAPAGLIFLOZIN PROPANEDIOL 5 MG PO TABS: ORAL_TABLET | ORAL | 3 refills | Status: DC

## 2023-04-26 MED ORDER — MELOXICAM 15 MG PO TABS
15.0000 mg | ORAL_TABLET | Freq: Every day | ORAL | 3 refills | Status: DC
Start: 2023-04-26 — End: 2023-09-30

## 2023-04-26 MED ORDER — AMLODIPINE BESYLATE 5 MG PO TABS: 5.0000 mg | ORAL_TABLET | Freq: Every day | ORAL | 3 refills | Status: DC

## 2023-04-26 MED ORDER — VALSARTAN 80 MG PO TABS: 80.0000 mg | ORAL_TABLET | Freq: Every day | ORAL | 3 refills | Status: DC

## 2023-04-26 MED ORDER — ATORVASTATIN CALCIUM 20 MG PO TABS: 20.0000 mg | ORAL_TABLET | Freq: Every day | ORAL | 3 refills | Status: DC

## 2023-04-26 NOTE — Patient Instructions (Addendum)
 I have sent the referral to the orthopedic specialist for your knee. I have asked that we can get you in quickly since this has been ongoing for a while. They will call you to schedule.   For now I would like you to wear your brace while you are up and around during the day. I am sending in medication to help with the pain and inflammation.  You can continue with the sports rubs- these will help with the pain and inflammation. You may also use tylenol  in addition to the meloxicam  to help with pain if needed. If the pain gets worse and the medication is not helping, please send me a message.   Acute Knee Pain, Adult Many things can cause knee pain. Sometimes, knee pain is sudden (acute). It may be caused by damage, swelling, or irritation of the muscles and tissues that support your knee. Pain may come from: A fall. An injury to the knee from twisting motions. A hit to the knee. Infection. The pain often goes away on its own with time and rest. If the pain does not go away, tests may be done to find out what is causing the pain. These may include: Imaging tests, such as an X-ray, MRI, CT scan, or ultrasound. Joint aspiration. In this test, fluid is removed from the knee and checked. Arthroscopy. In this test, a lighted tube is put in the knee and an image is shown on a screen. A biopsy. In this test, a health care provider will remove a small piece of tissue for testing. Follow these instructions at home: If you have a knee sleeve or brace that can be taken off:  Wear the knee sleeve or brace as told by your provider. Take it off only if your provider says that you can. Check the skin around it every day. Tell your provider if you see problems. Loosen the knee sleeve or brace if your toes tingle, are numb, or turn cold and blue. Keep the knee sleeve or brace clean and dry. Bathing If the knee sleeve or brace is not waterproof: Do not let it get wet. Cover it when you take a bath or shower.  Use a cover that does not let any water in. Managing pain, stiffness, and swelling  If told, put ice on the area. If you have a knee sleeve or brace that you can take off, remove it as told. Put ice in a plastic bag. Place a towel between your skin and the bag. Leave the ice on for 20 minutes, 2-3 times a day. If your skin turns bright red, take off the ice right away to prevent skin damage. The risk of damage is higher if you cannot feel pain, heat, or cold. Move your toes often to reduce stiffness and swelling. Raise the injured area above the level of your heart while you are sitting or lying down. Use a pillow to support your foot as needed. If told, use an elastic bandage to put pressure (compression) on your injured knee. This may control swelling, give support, and help with discomfort. Sleep with a pillow under your knee. Activity Rest your knee. Do not do things that cause pain or make pain worse. Do not stand or walk on your injured knee until you're told it's okay. Use crutches as told. Avoid activities where both feet leave the ground at the same time and put stress on the joints. Avoid running, jumping rope, and doing jumping jacks. Work with a  physical therapist to make a safe exercise program if told. Physical therapy helps your knee move better and get stronger. Exercise as told. General instructions Take your medicines only as told by your provider. If you are overweight, work with your provider and an expert in healthy eating, called a dietician, to set goals to lose weight. Being overweight can make your knee hurt more. Do not smoke, vape, or use products with nicotine or tobacco in them. If you need help quitting, talk with your provider. Return to normal activities when you are told. Ask what things are safe for you to do. Watch for any changes in your symptoms. Keep all follow-up visits. Your provider will check your healing and adjust treatments if needed. Contact a  health care provider if: The knee pain does not stop. The knee pain changes or gets worse. You have a fever along with knee pain. Your knee is red or feels warm when you touch it. Your knee gives out or locks up. Get help right away if: Your knee swells and the swelling gets worse. You cannot move your knee. You have very bad knee pain that does not get better with medicine. This information is not intended to replace advice given to you by your health care provider. Make sure you discuss any questions you have with your health care provider. Document Revised: 06/04/2022 Document Reviewed: 06/04/2022 Elsevier Patient Education  2024 [employer].

## 2023-04-26 NOTE — Progress Notes (Signed)
 BP 122/86   Pulse 83   Ht 6' 1 (1.854 m)   Wt 249 lb 12.8 oz (113.3 kg)   BMI 32.96 kg/m    Subjective:    Patient ID: Marco Campbell, male    DOB: 1966/04/01, 58 y.o.   MRN: [id number]  HPI: Marco Campbell is a 58 y.o. male presenting on 04/26/2023 for comprehensive medical examination.   Tahji has concerns of left knee pain that has been ongoing for over a month. The pain onset was sudden and without any known injury or precipitating event. The patient reported that the knee occasionally pops and feels like it might give out. The pain is primarily localized to the medial aspect of the knee, which is also noticeably swollen. Interestingly, the patient noted that standing or walking often feels better than sitting.  The patient has been managing the pain with over-the-counter analgesics, including extra-strength aspirin , and topical treatments such as horse liniment and tigerbalm patches. These treatments provide temporary relief but have not reduced the swelling. The patient has also been using a full knee brace, which reportedly improves comfort, especially during periods of increased activity. The patient's knee pain has been affecting his sleep, with reports of throbbing and sharp pain causing frequent position changes during the night.   He has a history of diabetes, which appears to be well-controlled without the need for home blood sugar monitoring.  The patient also suffers from allergies and sinus issues, which are currently managed with Flonase  and cetirizine .  He has not reported any changes in bowel habits, shortness of breath, cough, chest pain, palpitations, or numbness and tingling in the feet. He has not noticed any swelling in the ankles.  Pertinent items are noted in HPI.  IMMUNIZATIONS:   Flu Vaccine: Flu vaccine declined, patient does not wish to complete Vac Shingrix: Shingrix declined, patient does not wish to have this vaccine HPV: N/A or Aged Out Tetanus:  Tetanus completed in the last 10 years COVID: COVID completed, documentation in chart  RSV: No  HEALTH MAINTENANCE: Colon Cancer Screening HM Status: is up to date STI Testing HM Status: was declined  PSA ScreenHM Status: is up to date Lung Ca Screen HM Status: N/A AAA Screen HM Status: N/A  Concerns with hearing, vision, or dentition: No  Most Recent Depression Screen:     04/26/2023    9:03 AM 08/10/2021    2:13 PM 06/12/2021    4:06 PM 10/16/2019    3:18 PM 09/15/2019    3:02 PM  Depression screen PHQ 2/9  Decreased Interest 0 0 0 0 0  Down, Depressed, Hopeless 0 0 0 0 0  PHQ - 2 Score 0 0 0 0 0   Most Recent Anxiety Screen:     No data to display         Most Recent Falls Screen:    04/26/2023    9:03 AM 10/23/2022   10:01 AM 08/10/2021    2:12 PM 06/12/2021    4:06 PM 09/15/2019    3:02 PM  Fall Risk   Falls in the past year? 0 0 0 0 0  Number falls in past yr: 0 0 0 0 0  Injury with Fall? 0 0 0 0 0  Risk for fall due to : No Fall Risks No Fall Risks No Fall Risks No Fall Risks   Follow up Falls evaluation completed Falls evaluation completed Falls evaluation completed Falls evaluation completed     Past  medical history, surgical history, medications, allergies, family history and social history reviewed with patient today and changes made to appropriate areas of the chart.  Past Medical History:  Past Medical History:  Diagnosis Date   Acute non-recurrent pansinusitis 12/03/2022   AKI (acute kidney injury) (HCC) 07/09/2016   Allergy    Arthritis    Blurry vision 07/09/2016   Controlled type 2 diabetes mellitus without complication, without long-term current use of insulin  (HCC) 04/09/2017   Diabetes mellitus without complication (HCC)    Elevated LDL cholesterol level 03/05/2017   Hyperlipidemia    Hypertension    Oral thrush 07/09/2016   Sleep apnea    Does not use CPAP   SOB (shortness of breath) 11/07/2017   Medications:  Current Outpatient Medications  on File Prior to Visit  Medication Sig   Ascorbic Acid (VITAMIN C) 500 MG CAPS Take 1 tablet by mouth daily.   BAYER MICROLET LANCETS lancets by Other route. Pt was given this in office 09/05/16 pfm   blood glucose meter kit and supplies Dispense based on patient and insurance preference. Use up to four times daily as directed. (FOR ICD-9 250.00, 250.01).   cetirizine  (ZYRTEC ) 10 MG tablet Take 1 tablet (10 mg total) by mouth daily.   Cholecalciferol (VITAMIN D3) 1.25 MG (50000 UT) TABS Take 1 tablet by mouth daily.   Coenzyme Q10 (COQ10) 200 MG CAPS Take 1 capsule by mouth daily.   EPINEPHrine  0.3 mg/0.3 mL IJ SOAJ injection Inject 0.3 mLs (0.3 mg total) into the muscle as needed for anaphylaxis.   fluticasone  (FLONASE ) 50 MCG/ACT nasal spray Place 2 sprays into both nostrils 2 (two) times daily.   glucose blood test strip Contour test strips. Please provide strips for meter patient use. Test 1-2 times daily   Multiple Vitamins-Minerals (MULTIVITAMIN ADULTS 50+ PO) Take 1 tablet by mouth daily.   No current facility-administered medications on file prior to visit.   Surgical History:  Past Surgical History:  Procedure Laterality Date   ROTATOR CUFF REPAIR Right 11/2021   Dr. Dozier   Allergies:  Allergies  Allergen Reactions   Bee Venom Hives   Metformin  And Related Diarrhea    Tried XR as well    Social History:  Social History   Socioeconomic History   Marital status: Significant Other    Spouse name: Not on file   Number of children: Not on file   Years of education: Not on file   Highest education level: Not on file  Occupational History   Not on file  Tobacco Use   Smoking status: Every Day    Types: Cigarettes   Smokeless tobacco: Never   Tobacco comments:    Smokes 6-10/d (cigars with a cigarette filter)  Substance and Sexual Activity   Alcohol  use: Yes    Comment: occasionally   Drug use: No   Sexual activity: Not on file  Other Topics Concern   Not on  file  Social History Narrative   Not on file   Social Drivers of Health   Financial Resource Strain: Not on file  Food Insecurity: Not on file  Transportation Needs: Not on file  Physical Activity: Not on file  Stress: Not on file  Social Connections: Unknown (09/05/2021)   Received from [facility], [facility]   Social Network    Social Network: Not on file  Intimate Partner Violence: Unknown (07/27/2021)   Received from [facility], [facility]   HITS    Physically  Hurt: Not on file    Insult or Talk Down To: Not on file    Threaten Physical Harm: Not on file    Scream or Curse: Not on file   Social History   Tobacco Use  Smoking Status Every Day   Types: Cigarettes  Smokeless Tobacco Never  Tobacco Comments   Smokes 6-10/d (cigars with a cigarette filter)   Social History   Substance and Sexual Activity  Alcohol  Use Yes   Comment: occasionally   Family History:  Family History  Problem Relation Age of Onset   Diabetes Mother    Heart failure Mother        Died age 40   Diabetes Father    Heart disease Father    Heart attack Father 81       CABG twice.  Died age 51   Hypertension Brother    Diabetes Brother    Heart failure Brother 92   Diabetes Maternal Grandmother    Diabetes Sister    Heart failure Sister 46   Colon cancer Neg Hx    Esophageal cancer Neg Hx    Rectal cancer Neg Hx    Stomach cancer Neg Hx        Objective:    BP 122/86   Pulse 83   Ht 6' 1 (1.854 m)   Wt 249 lb 12.8 oz (113.3 kg)   BMI 32.96 kg/m   Wt Readings from Last 3 Encounters:  04/26/23 249 lb 12.8 oz (113.3 kg)  02/08/23 246 lb (111.6 kg)  10/23/22 252 lb (114.3 kg)    Physical Exam Vitals and nursing note reviewed.  Constitutional:      Appearance: Normal appearance.  HENT:     Head: Normocephalic and atraumatic.     Right Ear: Hearing, tympanic membrane, ear canal and external ear normal.     Left Ear: Hearing, tympanic membrane, ear canal and  external ear normal.     Nose: Nose normal.     Right Sinus: No maxillary sinus tenderness or frontal sinus tenderness.     Left Sinus: No maxillary sinus tenderness or frontal sinus tenderness.     Mouth/Throat:     Lips: Pink.     Mouth: Mucous membranes are moist.     Pharynx: Oropharynx is clear.  Eyes:     General: Lids are normal. Vision grossly intact.     Extraocular Movements: Extraocular movements intact.     Pupils: Pupils are equal, round, and reactive to light.     Funduscopic exam:    Right eye: No hemorrhage. Red reflex present.        Left eye: No hemorrhage. Red reflex present.    Visual Fields: Right eye visual fields normal and left eye visual fields normal.  Neck:     Thyroid: No thyromegaly.     Vascular: No carotid bruit or JVD.  Cardiovascular:     Rate and Rhythm: Normal rate and regular rhythm.     Chest Wall: PMI is not displaced.     Pulses: Normal pulses.          Dorsalis pedis pulses are 2+ on the right side and 2+ on the left side.       Posterior tibial pulses are 2+ on the right side and 2+ on the left side.     Heart sounds: Normal heart sounds. No murmur heard. Pulmonary:     Effort: Pulmonary effort is normal. No respiratory distress.  Breath sounds: Normal breath sounds.  Chest:  Breasts:    Breasts are symmetrical.  Abdominal:     General: Bowel sounds are normal. There is no distension or abdominal bruit.     Palpations: Abdomen is soft. There is no hepatomegaly, splenomegaly or mass.     Tenderness: There is no abdominal tenderness. There is no right CVA tenderness, left CVA tenderness, guarding or rebound.  Musculoskeletal:        General: Swelling and tenderness present.     Cervical back: Full passive range of motion without pain and neck supple. No tenderness. No spinous process tenderness or muscular tenderness.     Right lower leg: No edema.     Left lower leg: No edema.  Feet:     Right foot:     Toenail Condition: Right  toenails are normal.     Left foot:     Toenail Condition: Left toenails are normal.  Lymphadenopathy:     Cervical: No cervical adenopathy.     Upper Body:     Right upper body: No supraclavicular adenopathy.     Left upper body: No supraclavicular adenopathy.  Skin:    General: Skin is warm and dry.     Capillary Refill: Capillary refill takes less than 2 seconds.     Nails: There is no clubbing.  Neurological:     General: No focal deficit present.     Mental Status: He is alert and oriented to person, place, and time.     Cranial Nerves: No cranial nerve deficit.     Sensory: Sensation is intact. No sensory deficit.     Motor: Motor function is intact. No weakness.     Coordination: Coordination is intact. Coordination normal.     Gait: Gait is intact. Gait normal.  Psychiatric:        Attention and Perception: Attention normal.        Mood and Affect: Mood normal.        Speech: Speech normal.        Behavior: Behavior normal. Behavior is cooperative.        Cognition and Memory: Cognition and memory normal.      Results for orders placed or performed in visit on 04/26/23  CBC with Differential/Platelet   Collection Time: 04/26/23 10:26 AM  Result Value Ref Range   WBC 10.0 3.4 - 10.8 x10E3/uL   RBC 5.48 4.14 - 5.80 x10E6/uL   Hemoglobin 16.1 13.0 - 17.7 g/dL   Hematocrit 51.1 62.4 - 51.0 %   MCV 89 79 - 97 fL   MCH 29.4 26.6 - 33.0 pg   MCHC 33.0 31.5 - 35.7 g/dL   RDW 87.0 88.3 - 84.5 %   Platelets 203 150 - 450 x10E3/uL   Neutrophils 68 Not Estab. %   Lymphs 22 Not Estab. %   Monocytes 9 Not Estab. %   Eos 1 Not Estab. %   Basos 0 Not Estab. %   Neutrophils Absolute 6.7 1.4 - 7.0 x10E3/uL   Lymphocytes Absolute 2.2 0.7 - 3.1 x10E3/uL   Monocytes Absolute 0.9 0.1 - 0.9 x10E3/uL   EOS (ABSOLUTE) 0.1 0.0 - 0.4 x10E3/uL   Basophils Absolute 0.0 0.0 - 0.2 x10E3/uL   Immature Granulocytes 0 Not Estab. %   Immature Grans (Abs) 0.0 0.0 - 0.1 x10E3/uL  CMP14+EGFR    Collection Time: 04/26/23 10:26 AM  Result Value Ref Range   Glucose 82 70 - 99 mg/dL   BUN  12 6 - 24 mg/dL   Creatinine, Ser 8.87 0.76 - 1.27 mg/dL   eGFR 77 >40 fO/fpw/8.26   BUN/Creatinine Ratio 11 9 - 20   Sodium 141 134 - 144 mmol/L   Potassium 4.1 3.5 - 5.2 mmol/L   Chloride 104 96 - 106 mmol/L   CO2 23 20 - 29 mmol/L   Calcium  9.6 8.7 - 10.2 mg/dL   Total Protein 7.0 6.0 - 8.5 g/dL   Albumin 4.3 3.8 - 4.9 g/dL   Globulin, Total 2.7 1.5 - 4.5 g/dL   Bilirubin Total 0.4 0.0 - 1.2 mg/dL   Alkaline Phosphatase 81 44 - 121 IU/L   AST 20 0 - 40 IU/L   ALT 25 0 - 44 IU/L  Hemoglobin A1c   Collection Time: 04/26/23 10:26 AM  Result Value Ref Range   Hgb A1c MFr Bld 5.8 (H) 4.8 - 5.6 %   Est. average glucose Bld gHb Est-mCnc 120 mg/dL  Lipid panel   Collection Time: 04/26/23 10:26 AM  Result Value Ref Range   Cholesterol, Total 211 (H) 100 - 199 mg/dL   Triglycerides 94 0 - 149 mg/dL   HDL 62 >60 mg/dL   VLDL Cholesterol Cal 17 5 - 40 mg/dL   LDL Chol Calc (NIH) 867 (H) 0 - 99 mg/dL   Chol/HDL Ratio 3.4 0.0 - 5.0 ratio  Microalbumin / creatinine urine ratio   Collection Time: 04/26/23 10:26 AM  Result Value Ref Range   Creatinine, Urine 125.6 Not Estab. mg/dL   Microalbumin, Urine 4.6 Not Estab. ug/mL   Microalb/Creat Ratio 4 0 - 29 mg/g creat  PSA Total (Reflex To Free)   Collection Time: 04/26/23 10:26 AM  Result Value Ref Range   Prostate Specific Ag, Serum 0.9 0.0 - 4.0 ng/mL   Reflex Criteria Comment       Assessment & Plan:   Problem List Items Addressed This Visit     Hypertension associated with diabetes (HCC)   Blood pressure is well-controlled. No current issues reported. - Review and refill current medications as needed      Relevant Medications   amLODipine  (NORVASC ) 5 MG tablet   atorvastatin  (LIPITOR) 20 MG tablet   dapagliflozin  propanediol (FARXIGA ) 5 MG TABS tablet   valsartan  (DIOVAN ) 80 MG tablet   Other Relevant Orders   CBC with  Differential/Platelet (Completed)   CMP14+EGFR (Completed)   Hemoglobin A1c (Completed)   Lipid panel (Completed)   Controlled type 2 diabetes mellitus without complication, without long-term current use of insulin  (HCC)   No recent blood glucose monitoring at home. Patient reports sensing changes in blood sugar levels. Currently not on metformin  or hydrochlorothiazide  as per recent records. - Order A1c - Review and refill current medications as needed      Relevant Medications   atorvastatin  (LIPITOR) 20 MG tablet   dapagliflozin  propanediol (FARXIGA ) 5 MG TABS tablet   valsartan  (DIOVAN ) 80 MG tablet   Allergic rhinitis   Sinus issues managed with Flonase  and cetirizine , which are effective. - Continue current medications (Flonase  and cetirizine )      Hyperlipidemia associated with type 2 diabetes mellitus (HCC)   Chronic. Managed with lipitor. Labs pending.  -No changes in medication today      Relevant Medications   amLODipine  (NORVASC ) 5 MG tablet   atorvastatin  (LIPITOR) 20 MG tablet   dapagliflozin  propanediol (FARXIGA ) 5 MG TABS tablet   valsartan  (DIOVAN ) 80 MG tablet   Other Relevant Orders   CBC with  Differential/Platelet (Completed)   CMP14+EGFR (Completed)   Hemoglobin A1c (Completed)   Lipid panel (Completed)   Medial knee pain, left   Left knee pain for over a month, with swelling and occasional popping. Pain worsens with bending and improves with straightening. No clear injury history. Differential includes meniscus injury, possibly medial meniscus. Swelling noted on examination. Temporary relief with heat, ice, and topical treatments. Compression brace provides support. Orthopedic referral recommended for further evaluation, possibly with ultrasound. Discussed potential for self-healing with bracing and physical therapy; surgery may be necessary if it does not heal. - Refer to orthopedics for evaluation and possible ultrasound - Prescribe meloxicam  for pain and  swelling, 1x daily - Advise wearing knee brace for support while walking and moving - Continue using heat, ice, and topical treatments as needed - Avoid exercises until further evaluation - Order A1c and urinalysis      Relevant Medications   meloxicam  (MOBIC ) 15 MG tablet   Other Relevant Orders   Ambulatory referral to Orthopedic Surgery   Encounter for annual physical exam - Primary   CPE completed today. Review of HM activities and recommendations discussed and provided on AVS. Anticipatory guidance, diet, and exercise recommendations provided. Medications, allergies, and hx reviewed and updated as necessary. Orders placed as listed below.  Plan: - Labs ordered. Will make changes as necessary based on results.  - I will review these results and send recommendations via MyChart or a telephone call.  - F/U with CPE in 1 year or sooner for acute/chronic health needs as directed.        Relevant Orders   CBC with Differential/Platelet (Completed)   CMP14+EGFR (Completed)   Hemoglobin A1c (Completed)   Lipid panel (Completed)   PSA Total (Reflex To Free) (Completed)   Elevated serum creatinine   Relevant Medications   amLODipine  (NORVASC ) 5 MG tablet   atorvastatin  (LIPITOR) 20 MG tablet   dapagliflozin  propanediol (FARXIGA ) 5 MG TABS tablet   Vitamin D  deficiency   Relevant Medications   atorvastatin  (LIPITOR) 20 MG tablet   Other Visit Diagnoses       Need for shingles vaccine            Follow up plan: NEXT PREVENTATIVE PHYSICAL DUE IN 1 YEAR. Return in about 1 year (around 04/25/2024) for CPE.  LABORATORY TESTING:  Health maintenance labs ordered today, if applicable.    PATIENT COUNSELING:   For all adult patients, I recommend A well balanced diet low in saturated fats, cholesterol, and moderation in carbohydrates.  This can be as simple as monitoring portion sizes and cutting back on sugary beverages such as soda and juice to start with.    Daily water  consumption of at least 64 ounces.  Physical activity at least 180 minutes per week, if just starting out.  This can be as simple as taking the stairs instead of the elevator and walking 2-3 laps around the office  purposefully every day.   STD protection, partner selection, and regular testing if high risk.  Limited consumption of alcoholic beverages if alcohol  is consumed. For men, I recommend no more than 14 alcoholic beverages per week, spread out throughout the week (max 2 per day). Avoid binge drinking or consuming large quantities of alcohol  in one setting.  Please let me know if you feel you may need help with reduction or quitting alcohol  consumption.   Avoidance of nicotine, if used. Please let me know if you feel you may need help with reduction  or quitting nicotine use.   Daily mental health attention. This can be in the form of 5 minute daily meditation, prayer, journaling, yoga, reflection, etc.  Purposeful attention to your emotions and mental state can significantly improve your overall wellbeing and Health.  Please know that I am here to help you with all of your health care goals and am happy to work with you to find a solution that works best for you.  The greatest advice I have received with any changes in life are to take it one step at a time, that even means if all you can focus on is the next 60 seconds, then do that and celebrate your victories.  With any changes in life, you will have set backs, and that is OK. The important thing to remember is, if you have a set back, it is not a failure, it is an opportunity to try again!  Health Maintenance Recommendations Screening Testing Mammogram Every 1 -2 years based on history and risk factors Starting at age 58 Pap Smear Ages 21-39 every 3 years Ages 37-65 every 5 years with HPV testing More frequent testing may be required based on results and history Colon Cancer Screening Every 1-10 years based on test  performed, risk factors, and history Starting at age 34 Bone Density Screening Every 2-10 years based on history Starting at age 68 for women Recommendations for men differ based on medication usage, history, and risk factors AAA Screening One time ultrasound Men 63-74 years old who have every smoked Lung Cancer Screening Low Dose Lung CT every 12 months Age 85-80 years with a 30 pack-year smoking history who still smoke or who have quit within the last 15 years   Screening Labs Routine  Labs: Complete Blood Count (CBC), Complete Metabolic Panel (CMP), Cholesterol (Lipid Panel) Every 6-12 months based on history and medications May be recommended more frequently based on current conditions or previous results Hemoglobin A1c Lab Every 3-12 months based on history and previous results Starting at age 27 or earlier with diagnosis of diabetes, high cholesterol, BMI >26, and/or risk factors Frequent monitoring for patients with diabetes to ensure blood sugar control Thyroid Panel (TSH) Every 6 months based on history, symptoms, and risk factors May be repeated more often if on medication HIV One time testing for all patients 68 and older May be repeated more frequently for patients with increased risk factors or exposure Hepatitis C One time testing for all patients 23 and older May be repeated more frequently for patients with increased risk factors or exposure Gonorrhea, Chlamydia Every 12 months for all sexually active persons 13-24 years Additional monitoring may be recommended for those who are considered high risk or who have symptoms Every 12 months for any woman on birth control, regardless of sexual activity PSA Men 67-36 years old with risk factors Additional screening may be recommended from age 28-69 based on risk factors, symptoms, and history  Vaccine Recommendations Tetanus Booster All adults every 10 years Flu Vaccine All patients 6 months and older every  year COVID Vaccine All patients 12 years and older Initial dosing with booster May recommend additional booster based on age and health history HPV Vaccine 2 doses all patients age 34-26 Dosing may be considered for patients over 26 Shingles Vaccine (Shingrix) 2 doses all adults 55 years and older Pneumonia (Pneumovax 37) All adults 65 years and older May recommend earlier dosing based on health history One year apart from Prevnar 13 Pneumonia (Prevnar 6)  All adults 65 years and older Dosed 1 year after Pneumovax 23 Pneumonia (Prevnar 20) One time alternative to the two dosing of 13 and 23 For all adults with initial dose of 23, 20 is recommended 1 year later For all adults with initial dose of 13, 23 is still recommended as second option 1 year later  Additional Screening, Testing, and Vaccinations may be recommended on an individualized basis based on family history, health history, risk factors, and/or exposure.

## 2023-04-27 LAB — MICROALBUMIN / CREATININE URINE RATIO
Creatinine, Urine: 125.6 mg/dL
Microalb/Creat Ratio: 4 mg/g{creat} (ref 0–29)
Microalbumin, Urine: 4.6 ug/mL

## 2023-04-27 LAB — CBC WITH DIFFERENTIAL/PLATELET
Basophils Absolute: 0 10*3/uL (ref 0.0–0.2)
Basos: 0 %
EOS (ABSOLUTE): 0.1 10*3/uL (ref 0.0–0.4)
Eos: 1 %
Hematocrit: 48.8 % (ref 37.5–51.0)
Hemoglobin: 16.1 g/dL (ref 13.0–17.7)
Immature Grans (Abs): 0 10*3/uL (ref 0.0–0.1)
Immature Granulocytes: 0 %
Lymphocytes Absolute: 2.2 10*3/uL (ref 0.7–3.1)
Lymphs: 22 %
MCH: 29.4 pg (ref 26.6–33.0)
MCHC: 33 g/dL (ref 31.5–35.7)
MCV: 89 fL (ref 79–97)
Monocytes Absolute: 0.9 10*3/uL (ref 0.1–0.9)
Monocytes: 9 %
Neutrophils Absolute: 6.7 10*3/uL (ref 1.4–7.0)
Neutrophils: 68 %
Platelets: 203 10*3/uL (ref 150–450)
RBC: 5.48 x10E6/uL (ref 4.14–5.80)
RDW: 12.9 % (ref 11.6–15.4)
WBC: 10 10*3/uL (ref 3.4–10.8)

## 2023-04-27 LAB — CMP14+EGFR
ALT: 25 IU/L (ref 0–44)
AST: 20 IU/L (ref 0–40)
Albumin: 4.3 g/dL (ref 3.8–4.9)
Alkaline Phosphatase: 81 IU/L (ref 44–121)
BUN/Creatinine Ratio: 11 (ref 9–20)
BUN: 12 mg/dL (ref 6–24)
Bilirubin Total: 0.4 mg/dL (ref 0.0–1.2)
CO2: 23 mmol/L (ref 20–29)
Calcium: 9.6 mg/dL (ref 8.7–10.2)
Chloride: 104 mmol/L (ref 96–106)
Creatinine, Ser: 1.12 mg/dL (ref 0.76–1.27)
Globulin, Total: 2.7 g/dL (ref 1.5–4.5)
Glucose: 82 mg/dL (ref 70–99)
Potassium: 4.1 mmol/L (ref 3.5–5.2)
Sodium: 141 mmol/L (ref 134–144)
Total Protein: 7 g/dL (ref 6.0–8.5)
eGFR: 77 mL/min/{1.73_m2} (ref >59)

## 2023-04-27 LAB — LIPID PANEL
Cholesterol, Total: 211 mg/dL — ABNORMAL HIGH (ref 100–199)
HDL: 62 mg/dL (ref >39)
LDL CALC COMMENT:: 3.4 ratio (ref 0.0–5.0)
LDL Chol Calc (NIH): 132 mg/dL — ABNORMAL HIGH (ref 0–99)
Triglycerides: 94 mg/dL (ref 0–149)
VLDL Cholesterol Cal: 17 mg/dL (ref 5–40)

## 2023-04-27 LAB — HEMOGLOBIN A1C
Est. average glucose Bld gHb Est-mCnc: 120 mg/dL
Hgb A1c MFr Bld: 5.8 % — ABNORMAL HIGH (ref 4.8–5.6)

## 2023-04-27 LAB — PSA TOTAL (REFLEX TO FREE): Prostate Specific Ag, Serum: 0.9 ng/mL (ref 0.0–4.0)

## 2023-05-01 DIAGNOSIS — M25562 Pain in left knee: Secondary | ICD-10-CM | POA: Insufficient documentation

## 2023-05-01 DIAGNOSIS — Z0000 Encounter for general adult medical examination without abnormal findings: Secondary | ICD-10-CM | POA: Insufficient documentation

## 2023-05-01 NOTE — Assessment & Plan Note (Signed)
 No recent blood glucose monitoring at home. Patient reports sensing changes in blood sugar levels. Currently not on metformin or hydrochlorothiazide as per recent records. - Order A1c - Review and refill current medications as needed

## 2023-05-01 NOTE — Assessment & Plan Note (Signed)
 Chronic. Managed with lipitor. Labs pending.  -No changes in medication today

## 2023-05-01 NOTE — Assessment & Plan Note (Signed)

## 2023-05-01 NOTE — Assessment & Plan Note (Signed)
 Sinus issues managed with Flonase and cetirizine, which are effective. - Continue current medications (Flonase and cetirizine)

## 2023-05-01 NOTE — Assessment & Plan Note (Signed)
 Left knee pain for over a month, with swelling and occasional popping. Pain worsens with bending and improves with straightening. No clear injury history. Differential includes meniscus injury, possibly medial meniscus. Swelling noted on examination. Temporary relief with heat, ice, and topical treatments. Compression brace provides support. Orthopedic referral recommended for further evaluation, possibly with ultrasound. Discussed potential for self-healing with bracing and physical therapy; surgery may be necessary if it does not heal. - Refer to orthopedics for evaluation and possible ultrasound - Prescribe meloxicam  for pain and swelling, 1x daily - Advise wearing knee brace for support while walking and moving - Continue using heat, ice, and topical treatments as needed - Avoid exercises until further evaluation - Order A1c and urinalysis

## 2023-05-01 NOTE — Assessment & Plan Note (Signed)
 Blood pressure is well-controlled. No current issues reported. - Review and refill current medications as needed

## 2023-05-06 ENCOUNTER — Other Ambulatory Visit (INDEPENDENT_AMBULATORY_CARE_PROVIDER_SITE_OTHER): Payer: Self-pay | Admitting: Otolaryngology

## 2023-08-08 LAB — HM DIABETES EYE EXAM

## 2023-09-27 ENCOUNTER — Ambulatory Visit: Payer: Self-pay

## 2023-09-27 NOTE — Telephone Encounter (Signed)
 Please schedule

## 2023-09-27 NOTE — Telephone Encounter (Signed)
 FYI Only or Action Required?: FYI only for provider  Patient was last seen in primary care on 04/26/2023 by Early, Adriane Albe, NP. Called Nurse Triage reporting Swallowed Foreign Body. Symptoms began yesterday. Interventions attempted: Nothing. Symptoms are: unchanged.  Triage Disposition: See Physician Within 24 Hours- Unable to schedule within disp d/t avail. Scheduled for 06/09. See triage note.   Patient/caregiver understands and will follow disposition?: Yes  Patient educated on pertinent s/s that would warrant emergent help/call 911/ ED/UC.        Copied from CRM [id number]. Topic: Clinical - Red Word Triage >> Sep 27, 2023 12:25 PM Oddis Bench wrote: Red Word that prompted transfer to Nurse Triage: Patient is stating tht it feels like something stuck in his throat when he eat's, states saliva comes up a little mucus but no food. Reason for Disposition  [1] Symptoms of pill stuck in throat or esophagus (e.g., pain in throat or chest, FB sensation) AND [2] no relief after using Care Advice  Answer Assessment - Initial Assessment Questions 1. DESCRIPTION: "Tell me more about this problem." "Are you  having trouble swallowing liquids, solids, or both?" "Any trouble with swallowing saliva (spit)?"     -------------- Feels like something is stuck in his throat ------------------- Pt CAN swallow. Just feeling a foreign body sensation even hours after eating.    2. SEVERITY: "How bad is the swallowing difficulty?"  (e.g., Scale 1-10; or mild, moderate, severe)   - MILD (0-3): Occasional swallowing difficulty; has trouble swallowing certain types of foods or liquids.   - MODERATE (4-7): Frequent swallowing difficulty; only able to swallow small amounts of foods and fluids.   - SEVERE (8-10): Unable to swallow any foods, fluids, or saliva; sensation of "lump in throat" or "something stuck in throat", and frequent drooling or spitting may be present.      ----------------------------n/a Can swallow.  Its just a sensation that food is still in his throat still there.     3. ONSET: "When did the swallowing problems begin?"      ---- a few days ago    4. CAUSE: "What do you think is causing the problem?"  (e.g., dry mouth, food or pill stuck in throat, mouth pain, sore throat, progression of disease process such as dementia or Parkinson's disease).     --------------- Unsure     5. CHRONIC or RECURRENT: "Is this a new problem for you?"  If No, ask: "How long have you had this problem?" (e.g., days, weeks, months)      ---------------- Denies     6. OTHER SYMPTOMS: "Do you have any other symptoms?" (e.g., chest pain, difficulty breathing, mouth sores, sore throat, swollen tongue, chest pain)     ----------------------Faint feeling of nausea last night. Dissipated now.     Additional information: --- Feels sensation after a few hours  Protocols used: Swallowing Difficulty-A-AH

## 2023-09-27 NOTE — Telephone Encounter (Signed)
 Pt declined earlier appt.

## 2023-09-30 ENCOUNTER — Ambulatory Visit: Admitting: Family Medicine

## 2023-09-30 ENCOUNTER — Encounter: Payer: Self-pay | Admitting: Family Medicine

## 2023-09-30 VITALS — BP 128/70 | HR 76 | Ht 73.0 in | Wt 255.4 lb

## 2023-09-30 DIAGNOSIS — R1310 Dysphagia, unspecified: Secondary | ICD-10-CM

## 2023-09-30 DIAGNOSIS — K219 Gastro-esophageal reflux disease without esophagitis: Secondary | ICD-10-CM | POA: Diagnosis not present

## 2023-09-30 DIAGNOSIS — Z9109 Other allergy status, other than to drugs and biological substances: Secondary | ICD-10-CM | POA: Diagnosis not present

## 2023-09-30 MED ORDER — EPINEPHRINE 0.3 MG/0.3ML IJ SOAJ: 0.3000 mg | INTRAMUSCULAR | 0 refills | Status: AC | PRN

## 2023-09-30 NOTE — Patient Instructions (Signed)
 Stay well hydrated.  Please drink more water, and less caffeine. Continue your flonase , zyrtec , and sinus rinses as needed. I recommend starting Mucinex 12 hour (plain, not the D or the DM), and take this twice daily. This helps loosen up your secretions (mucus and phlegm).  This might help with your throat symptoms.  Your throat symptoms might also be caused by gastroesophageal reflux.  Cutting back on caffeine, tomatoes, alcohol , spicy foods, eating large meals (better to eat small frequent meals), waiting at least 2-3 hours after eating before laying down. Take prilosec OTC (omeprazole 20 mg) once daily, prior to eating dinner. Do this for a 2 week trial.  Follow up in 2 weeks if your symptoms aren't improved. We can then decide the next steps.

## 2023-09-30 NOTE — Progress Notes (Signed)
 Chief Complaint  Patient presents with   Consult    For the last few weeks to a month after he eats feels like something is stuck in his throat, he states he feels like peristalsis is stuck. Brings up mucus from this throat. When he lies down at night it is worse and he does wake up at night from this. Also asking for a new epi pen today.   He scheduled appointment on 6/6. At that time he stated that a few days prior he reported feeling like something was stuck in his throat when he eats, states saliva comes up a little mucus but no food.  Feels like something is stuck in his throat.  He can eat and swallow, just feeling a foreign body sensation even hours after eating.   He has environment allergies, with sinus problems. He saw ENT in 03/2023 and was started on flonase , zyrtec  and sinus rinses, sudafed prn severe symptoms.  CT didn't show significant sinus disease or need for surgery.  Possibility of allergy evaluation was discussed.  Sinuses have improved on this regimen.  Hasn't taken his meds yet today, has a slight sinus headache. Coughs up some phlegm in the morning, and at end of the day. Describes it as white and thick.  He is accompanied by his wife today.  She reports that he frequently wakes up with hiccups and nausea.  He cut back on alcohol . Has a lot of caffeine (zero [employer], 3/d) and sweet tea. Not much fruit.  Some tomato sauces.   PMH, PSH, SH reviewed HTN, T2DM, environmental allergies  Lab Results  Component Value Date   HGBA1C 5.8 (H) 04/26/2023   Outpatient Encounter Medications as of 09/30/2023  Medication Sig   amLODipine  (NORVASC ) 5 MG tablet Take 1 tablet (5 mg total) by mouth daily.   atorvastatin  (LIPITOR) 20 MG tablet Take 1 tablet (20 mg total) by mouth daily.   BAYER MICROLET LANCETS lancets by Other route. Pt was given this in office 09/05/16 pfm   blood glucose meter kit and supplies Dispense based on patient and insurance preference. Use up to four  times daily as directed. (FOR ICD-9 250.00, 250.01).   cetirizine  (ZYRTEC ) 10 MG tablet Take 1 tablet (10 mg total) by mouth daily.   Coenzyme Q10 (COQ10) 200 MG CAPS Take 1 capsule by mouth daily.   dapagliflozin  propanediol (FARXIGA ) 5 MG TABS tablet TAKE 1 TABLET BY MOUTH EVERY DAY BEFORE BREAKFAST   fluticasone  (FLONASE ) 50 MCG/ACT nasal spray PLACE 2 SPRAYS INTO BOTH NOSTRILS 2 (TWO) TIMES DAILY   glucose blood test strip Contour test strips. Please provide strips for meter patient use. Test 1-2 times daily   Multiple Vitamins-Minerals (MULTIVITAMIN ADULTS 50+ PO) Take 1 tablet by mouth daily.   valsartan  (DIOVAN ) 80 MG tablet Take 1 tablet (80 mg total) by mouth daily.   [DISCONTINUED] Ascorbic Acid (VITAMIN C) 500 MG CAPS Take 1 tablet by mouth daily.   EPINEPHrine  0.3 mg/0.3 mL IJ SOAJ injection Inject 0.3 mLs (0.3 mg total) into the muscle as needed for anaphylaxis. (Patient not taking: Reported on 09/30/2023)   [DISCONTINUED] Cholecalciferol (VITAMIN D3) 1.25 MG (50000 UT) TABS Take 1 tablet by mouth daily.   [DISCONTINUED] meloxicam  (MOBIC ) 15 MG tablet Take 1 tablet (15 mg total) by mouth daily.   No facility-administered encounter medications on file as of 09/30/2023.   Allergies  Allergen Reactions   Bee Venom Hives   Metformin  And Related Diarrhea  Tried XR as well    Shrimp (Diagnostic) Nausea Only   ROS: no f/c, some sinus HA and congestion today.  No purulent drainage. Some PND. No cough, shortness of breath. Morning nausea. No vomiting, diarrhea.  Feeling like food stuck in throat per HPI. No trouble swallowing. No abdominal pain. See HPI    PHYSICAL EXAM:  BP 128/70   Pulse 76   Ht 6\' 1"  (1.854 m)   Wt 255 lb 6.4 oz (115.8 kg)   BMI 33.70 kg/m   Pleasant male, with occasional throat-clearing during visit. No sniffling or cough. HEENT conjunctiva and sclera are clear, EOMI. TM's and EAC normal.  Nasal mucosa is normal on the right, mild-mod edematous with  clear mucus on the left.   Mild septal deviation to the left. OP is clear Sinuses nontender Neck: no lymphadenopathy or thyromegaly or mass Heart: regular rate and rhythm Lungs: clear bilaterally Abdomen: soft, nontender, no mass Extremities: no edema Psych: normal mood, affect, hygiene and grooming Neuro: alert and oriented, cranial nerves grossly intact, normal gait.    ASSESSMENT/PLAN:  Dysphagia, unspecified type - DDx reviewed, most likely related to allergies/PND and GERD. Doubt fixed obstruction. If not improving with recs, may need ENT or GI eval  Environmental allergies - continue flonase , zyrtec , sinus rinses.  Recommend Mucinex 12 hour BID  Gastroesophageal reflux disease without esophagitis - suspected d/t hiccups, morning nausea. Reviewed proper diet/behavior. Trial of prilosec OTC x2 wks. f/u if not better in 2 wks  Stay well hydrated.  Please drink more water, and less caffeine. Continue your flonase , zyrtec , and sinus rinses as needed. I recommend starting Mucinex 12 hour (plain, not the D or the DM), and take this twice daily. This helps loosen up your secretions (mucus and phlegm).  This might help with your throat symptoms.  Your throat symptoms might also be caused by gastroesophageal reflux.  Cutting back on caffeine, tomatoes, alcohol , spicy foods, eating large meals (better to eat small frequent meals), waiting at least 2-3 hours after eating before laying down. Take prilosec OTC (omeprazole 20 mg) once daily, prior to eating dinner. Do this for a 2 week trial.  Follow up in 2 weeks if your symptoms aren't improved. We can then decide the next steps.

## 2023-10-10 ENCOUNTER — Encounter: Payer: Self-pay | Admitting: Nurse Practitioner

## 2023-10-11 ENCOUNTER — Other Ambulatory Visit: Payer: Self-pay | Admitting: Nurse Practitioner

## 2023-10-11 DIAGNOSIS — K219 Gastro-esophageal reflux disease without esophagitis: Secondary | ICD-10-CM

## 2023-10-11 DIAGNOSIS — R1310 Dysphagia, unspecified: Secondary | ICD-10-CM

## 2023-10-11 MED ORDER — PANTOPRAZOLE SODIUM 40 MG PO TBEC
40.0000 mg | DELAYED_RELEASE_TABLET | Freq: Every day | ORAL | 3 refills | Status: AC
Start: 2023-10-11 — End: ?

## 2023-10-14 ENCOUNTER — Ambulatory Visit (INDEPENDENT_AMBULATORY_CARE_PROVIDER_SITE_OTHER): Admitting: Nurse Practitioner

## 2023-10-14 ENCOUNTER — Encounter: Payer: Self-pay | Admitting: Nurse Practitioner

## 2023-10-14 VITALS — BP 132/78 | HR 79 | Wt 255.4 lb

## 2023-10-14 DIAGNOSIS — E1159 Type 2 diabetes mellitus with other circulatory complications: Secondary | ICD-10-CM | POA: Diagnosis not present

## 2023-10-14 DIAGNOSIS — R21 Rash and other nonspecific skin eruption: Secondary | ICD-10-CM | POA: Diagnosis not present

## 2023-10-14 DIAGNOSIS — K219 Gastro-esophageal reflux disease without esophagitis: Secondary | ICD-10-CM | POA: Diagnosis not present

## 2023-10-14 DIAGNOSIS — I152 Hypertension secondary to endocrine disorders: Secondary | ICD-10-CM | POA: Diagnosis not present

## 2023-10-14 MED ORDER — AMLODIPINE BESYLATE 10 MG PO TABS: 10.0000 mg | ORAL_TABLET | Freq: Every day | ORAL | 3 refills | Status: DC

## 2023-10-14 NOTE — Patient Instructions (Addendum)
 We will increase the amlodipine  to 10mg  once a day. You can take 2 of the 5mg  tablets you have until those are gone. I have sent in a new prescription for the 10mg .  Continue to hold the Valsartan . If any of the medication caused an allergic reaction this would be my guess.  Check your blood pressure once a day for me for the next 2 weeks and send me the readings on MyChart and I will let you know if we need to make other changes.   Let me know if the rash gets worse or comes back.

## 2023-10-14 NOTE — Assessment & Plan Note (Signed)
 Developed a rash after starting omeprazole and guaifenesin or dextromethorphan. Rash localized to wrist, face, underarm, and groin, associated with itching. Rash persisted despite discontinuation of medications. Presentation atypical for a medication reaction, as it was not widespread and did not resolve quickly after stopping the medication. Differential diagnosis includes possible allergic reaction to valsartan , given reaction to lisinopril . - Discontinue valsartan  - Use hydrocortisone cream for itching - Keep affected areas covered, especially while cooking

## 2023-10-14 NOTE — Assessment & Plan Note (Signed)
 Hypertension managed with amlodipine  and valsartan . Valsartan  discontinued due to suspected allergic reaction. Discussed increasing amlodipine  dose to manage blood pressure, noting higher doses can cause pedal edema. No edema experienced with current dose. Emphasized importance of monitoring blood pressure regularly, especially after changes in medication.  - Increase amlodipine  dose to 10 mg daily - Monitor blood pressure daily, especially before caffeine intake or at bedtime - Report any significant changes in blood pressure

## 2023-10-14 NOTE — Progress Notes (Signed)
 Marco FORBES Doing, DNP, AGNP-c [facility] [address] [place], [place] [address] [phone]   ACUTE VISIT- ESTABLISHED PATIENT  Blood pressure 132/78, pulse 79, weight 255 lb 6.4 oz (115.8 kg).  Subjective:  HPI History of Present Illness SHAMAN MUSCARELLA is a 58 year old male with hypertension who presents with a rash and swallowing difficulties. He is accompanied by his wife, Luke.  He has been experiencing swallowing difficulties and was initially prescribed omeprazole and mucinex, which initially improved his symptoms, however there were a few days this was not effective. He discontinued it due to the development of a rash. He is open to trying pantoprazole .   The rash, characterized by hives and itching, is primarily located on his right wrist, face, left underarm, and groin area. It began last week and worsened a few days ago, he reports it is improving.SABRA He has been using Benadryl  and hydrocortisone cream, which have provided some relief. He stopped taking Prilosec four days ago, suspecting it might be related to the rash.  He has a history of hypertension and was previously on valsartan , which he stopped a few days ago due to concerns this triggered the rash, as well. SABRA He is currently taking amlodipine  5 mg for blood pressure control. He monitors his blood pressure at home, especially after changes in medication and is open to Campbell this with this change. No swelling in his feet has been noted.  He works as a Financial risk analyst and has not been outside recently, which rules out environmental causes for the rash.   ROS negative except for what is listed in HPI. History, Medications, Surgery, SDOH, and Family History reviewed and updated as appropriate.  Objective:  Physical Exam Vitals and nursing note reviewed.  Constitutional:      Appearance: Normal appearance. He is obese.  HENT:     Head: Normocephalic.   Eyes:     Conjunctiva/sclera: Conjunctivae normal.   Neck:      Vascular: No carotid bruit.   Cardiovascular:     Rate and Rhythm: Normal rate and regular rhythm.     Pulses: Normal pulses.     Heart sounds: Normal heart sounds.  Pulmonary:     Effort: Pulmonary effort is normal.     Breath sounds: Normal breath sounds.  Abdominal:     General: There is no distension.     Palpations: Abdomen is soft.     Tenderness: There is no abdominal tenderness. There is no guarding.   Musculoskeletal:     Cervical back: Normal range of motion. No tenderness.     Right lower leg: No edema.     Left lower leg: No edema.  Lymphadenopathy:     Cervical: No cervical adenopathy.   Skin:    General: Skin is warm and dry.     Capillary Refill: Capillary refill takes less than 2 seconds.     Findings: Rash present. Rash is vesicular.     Comments: Vesicular rash on the right wrist. Appears to have cleared from the left axilla and face. No signs of infection present.    Neurological:     General: No focal deficit present.     Mental Status: He is alert and oriented to person, place, and time.   Psychiatric:        Mood and Affect: Mood normal.         Assessment & Plan:   Problem List Items Addressed This Visit     Hypertension associated with diabetes (  HCC)   Hypertension managed with amlodipine  and valsartan . Valsartan  discontinued due to suspected allergic reaction. Discussed increasing amlodipine  dose to manage blood pressure, noting higher doses can cause pedal edema. No edema experienced with current dose. Emphasized importance of monitoring blood pressure regularly, especially after changes in medication.  - Increase amlodipine  dose to 10 mg daily - Monitor blood pressure daily, especially before caffeine intake or at bedtime - Report any significant changes in blood pressure      Relevant Medications   amLODipine  (NORVASC ) 10 MG tablet   Rash - Primary   Developed a rash after starting omeprazole and guaifenesin or dextromethorphan. Rash  localized to wrist, face, underarm, and groin, associated with itching. Rash persisted despite discontinuation of medications. Presentation atypical for a medication reaction, as it was not widespread and did not resolve quickly after stopping the medication. Differential diagnosis includes possible allergic reaction to valsartan , given reaction to lisinopril . - Discontinue valsartan  - Use hydrocortisone cream for itching - Keep affected areas covered, especially while cooking      Gastroesophageal reflux disease   Experiencing dysphagia and sensation of fullness in the throat, possibly related to GERD. Previously used over-the-counter famotidine  and omeprazole but discontinued due to rash. Prescribed pantoprazole  as an alternative to manage symptoms. Discussed trying omeprazole again if needed, but only one dose to monitor for any adverse reactions. - Start pantoprazole  as prescribed - Monitor for any adverse reactions or recurrence of rash - Report any persistent or worsening symptoms         Marco FORBES Doing, DNP, AGNP-c

## 2023-10-14 NOTE — Assessment & Plan Note (Signed)
 Experiencing dysphagia and sensation of fullness in the throat, possibly related to GERD. Previously used over-the-counter famotidine  and omeprazole but discontinued due to rash. Prescribed pantoprazole  as an alternative to manage symptoms. Discussed trying omeprazole again if needed, but only one dose to monitor for any adverse reactions. - Start pantoprazole  as prescribed - Monitor for any adverse reactions or recurrence of rash - Report any persistent or worsening symptoms

## 2023-10-21 ENCOUNTER — Telehealth: Payer: Self-pay | Admitting: Nurse Practitioner

## 2023-10-21 ENCOUNTER — Encounter: Payer: Self-pay | Admitting: Nurse Practitioner

## 2023-10-21 DIAGNOSIS — R21 Rash and other nonspecific skin eruption: Secondary | ICD-10-CM

## 2023-10-21 DIAGNOSIS — J3089 Other allergic rhinitis: Secondary | ICD-10-CM

## 2023-10-21 DIAGNOSIS — E1159 Type 2 diabetes mellitus with other circulatory complications: Secondary | ICD-10-CM

## 2023-10-21 NOTE — Telephone Encounter (Signed)
 Copied from CRM [id number]. Topic: General - Other >> Oct 21, 2023  9:58 AM Donee H wrote: Reason for CRM: Patient called to state wanted to give Marco Campbell update on how he is with blood pressure after medication was changed. Patient states blood pressure 144/78 average each day. Callback number [phone]

## 2023-11-11 ENCOUNTER — Encounter: Payer: Self-pay | Admitting: Nurse Practitioner

## 2023-11-12 ENCOUNTER — Other Ambulatory Visit: Payer: Self-pay

## 2023-11-12 DIAGNOSIS — E1159 Type 2 diabetes mellitus with other circulatory complications: Secondary | ICD-10-CM

## 2023-11-12 MED ORDER — AMLODIPINE BESYLATE 10 MG PO TABS: 10.0000 mg | ORAL_TABLET | Freq: Every day | ORAL | 3 refills | Status: DC

## 2023-12-11 MED ORDER — VALSARTAN 80 MG PO TABS: 80.0000 mg | ORAL_TABLET | Freq: Every day | ORAL | 3 refills | Status: DC

## 2023-12-11 NOTE — Telephone Encounter (Signed)
 Please call Marco Campbell to ask if he has ever taken hydrochlorothiazide  without lisinopril  and, if so, did he have a reaction to this.  Also ask how his blood pressures have been looking. If still over 130, I would like to send in an additional medication, but we are very limited due to allergies.

## 2023-12-11 NOTE — Addendum Note (Signed)
 Addended by: Glinda Natzke, CAMIE E on: 12/11/2023 03:04 PM   Modules accepted: Orders

## 2024-01-06 ENCOUNTER — Other Ambulatory Visit: Payer: Self-pay | Admitting: Nurse Practitioner

## 2024-01-06 DIAGNOSIS — R1310 Dysphagia, unspecified: Secondary | ICD-10-CM

## 2024-01-06 DIAGNOSIS — K219 Gastro-esophageal reflux disease without esophagitis: Secondary | ICD-10-CM

## 2024-05-05 ENCOUNTER — Ambulatory Visit: Payer: 59 | Admitting: Nurse Practitioner

## 2024-05-05 ENCOUNTER — Encounter: Payer: Self-pay | Admitting: Nurse Practitioner

## 2024-05-05 VITALS — BP 122/82 | HR 73 | Ht 73.0 in | Wt 251.0 lb

## 2024-05-05 DIAGNOSIS — Z125 Encounter for screening for malignant neoplasm of prostate: Secondary | ICD-10-CM | POA: Diagnosis not present

## 2024-05-05 DIAGNOSIS — E1169 Type 2 diabetes mellitus with other specified complication: Secondary | ICD-10-CM | POA: Diagnosis not present

## 2024-05-05 DIAGNOSIS — Z1329 Encounter for screening for other suspected endocrine disorder: Secondary | ICD-10-CM

## 2024-05-05 DIAGNOSIS — Z1321 Encounter for screening for nutritional disorder: Secondary | ICD-10-CM

## 2024-05-05 DIAGNOSIS — G4730 Sleep apnea, unspecified: Secondary | ICD-10-CM

## 2024-05-05 DIAGNOSIS — E66811 Obesity, class 1: Secondary | ICD-10-CM

## 2024-05-05 DIAGNOSIS — E1159 Type 2 diabetes mellitus with other circulatory complications: Secondary | ICD-10-CM | POA: Diagnosis not present

## 2024-05-05 DIAGNOSIS — E6609 Other obesity due to excess calories: Secondary | ICD-10-CM

## 2024-05-05 DIAGNOSIS — Z0000 Encounter for general adult medical examination without abnormal findings: Secondary | ICD-10-CM | POA: Diagnosis not present

## 2024-05-05 DIAGNOSIS — E785 Hyperlipidemia, unspecified: Secondary | ICD-10-CM

## 2024-05-05 DIAGNOSIS — R7989 Other specified abnormal findings of blood chemistry: Secondary | ICD-10-CM | POA: Diagnosis not present

## 2024-05-05 DIAGNOSIS — I152 Hypertension secondary to endocrine disorders: Secondary | ICD-10-CM

## 2024-05-05 DIAGNOSIS — E559 Vitamin D deficiency, unspecified: Secondary | ICD-10-CM | POA: Diagnosis not present

## 2024-05-05 MED ORDER — AMLODIPINE BESYLATE 10 MG PO TABS: 10.0000 mg | ORAL_TABLET | Freq: Every day | ORAL | 3 refills | Status: AC

## 2024-05-05 MED ORDER — VALSARTAN 80 MG PO TABS: 80.0000 mg | ORAL_TABLET | Freq: Every day | ORAL | 3 refills | Status: AC

## 2024-05-05 MED ORDER — DAPAGLIFLOZIN PROPANEDIOL 5 MG PO TABS: ORAL_TABLET | ORAL | 3 refills | Status: AC

## 2024-05-05 MED ORDER — ATORVASTATIN CALCIUM 20 MG PO TABS: 20.0000 mg | ORAL_TABLET | Freq: Every day | ORAL | 3 refills | Status: DC

## 2024-05-05 NOTE — Assessment & Plan Note (Addendum)
 MI 33.12 today. Patient is active at work with frequent walking. No structured exercise. Recommend purposeful daily activity to get the heart rate up for a sustained 20 minutes to help with overall health, blood sugar, blood pressure, cholesterol, and weight.  Orders:   CBC with Differential/Platelet   CMP14+EGFR   Hemoglobin A1c   Lipid panel   TSH   Vitamin B12   Microalbumin/Creatinine Ratio, Urine   T4, free

## 2024-05-05 NOTE — Progress Notes (Signed)
 Pneumonia Vaccine Pt declined., Influenza Vaccine Pt declined., and Shingles (Shingrx) Vaccine Pt declined.  Catheline Doing, DNP, AGNP-c [facility] [address] [place], [place] [address] Main Office [phone] VISIT TYPE: CPE on 05/05/2024 Today's Vitals   05/05/24 0819  BP: 122/82  Pulse: 73  Weight: 251 lb (113.9 kg)  Height: 6' 1 (1.854 m)   Body mass index is 33.12 kg/m.  Wt Readings from Last 3 Encounters:  05/05/24 251 lb (113.9 kg)  10/14/23 255 lb 6.4 oz (115.8 kg)  09/30/23 255 lb 6.4 oz (115.8 kg)     Subjective:  Annual Exam (CPE, fasting labs, forgets things off and on, check thyroid, sleep study, check b-12)  History of Present Illness Marco Campbell is a 59 year old male who presents for an annual physical exam.  He experiences intermittent forgetfulness and has requested B12 and thyroid labs to be taken.   He has type 2 diabetes and takes Farxiga . He occasionally checks his blood sugars and feels he can self-correct when levels are off by eating or drinking water. No numbness, tingling, or sores on his feet. No wounds or sores that will not heal.   He has hypertension and takes amlodipine  and valsartan . He previously thought he may have had an allergy to valsartan , but has not had any issues since continuing the medication.   He has hyperlipidemia and a BMI of 33. 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 He follows a low carbohydrate diet, overall.   He has a history of sleep apnea and previously used a CPAP machine, which was discontinued after a sleep study showed improvement. However, he reports snoring again, as noted by his wife. He requests a repeat sleep study.   He experiences occasional sinus drainage leading to phlegm and coughing, attributed to allergies. He uses over-the-counter medications like Zyrtec  or Flonase  as needed.  He smokes between five and eight  cigarettes a day and has been cutting back. He is working on reducing his intake. He does have Chantix  available.   No chest pain, shortness of breath, or swelling in his feet.  He has a recurring rash on his right wrist that appears blistery and itchy, resolving with Benadryl  and hydrocortisone within three days. There is a picture of this in MyChart. He had an occurrence in the summer and another episode last month.   No issues with hearing or vision.  Pertinent items are noted in HPI.     05/05/2024    8:17 AM 04/26/2023    9:03 AM 08/10/2021    2:13 PM 06/12/2021    4:06 PM 10/16/2019    3:18 PM  Depression screen PHQ 2/9  Decreased Interest 0 0 0 0 0  Down, Depressed, Hopeless 0 0 0 0 0  PHQ - 2 Score 0 0 0 0 0        No data to display             05/05/2024    8:17 AM 04/26/2023    9:03 AM 10/23/2022   10:01 AM 08/10/2021    2:12 PM 06/12/2021    4:06 PM  Fall Risk   Falls in the past year? 0 0 0 0 0  Number falls in past yr: 0 0 0 0 0  Injury with Fall? 0 0  0  0  0   Risk for fall due to : No Fall  Risks No Fall Risks No Fall Risks No Fall Risks No Fall Risks  Follow up Falls evaluation completed Falls evaluation completed Falls evaluation completed Falls evaluation completed  Falls evaluation completed      Data saved with a previous flowsheet row definition   Past medical history, surgical history, medications, allergies, family history and social history reviewed with patient today and changes made to appropriate areas of the chart.      Objective:    Physical Exam Vitals and nursing note reviewed.  Constitutional:      General: He is not in acute distress.    Appearance: Normal appearance.  HENT:     Head: Normocephalic and atraumatic.     Right Ear: Hearing, tympanic membrane, ear canal and external ear normal.     Left Ear: Hearing, tympanic membrane, ear canal and external ear normal.     Nose: Nose normal.     Right Sinus: No maxillary sinus tenderness  or frontal sinus tenderness.     Left Sinus: No maxillary sinus tenderness or frontal sinus tenderness.     Mouth/Throat:     Lips: Pink.     Mouth: Mucous membranes are moist.     Pharynx: Oropharynx is clear.  Eyes:     General: Lids are normal. Vision grossly intact. No scleral icterus.    Extraocular Movements: Extraocular movements intact.     Pupils: Pupils are equal, round, and reactive to light.     Funduscopic exam:    Right eye: No hemorrhage. Red reflex present.        Left eye: No hemorrhage. Red reflex present.    Visual Fields: Right eye visual fields normal and left eye visual fields normal.  Neck:     Thyroid: No thyromegaly.     Vascular: No carotid bruit or JVD.  Cardiovascular:     Rate and Rhythm: Normal rate and regular rhythm.     Chest Wall: PMI is not displaced.     Pulses: Normal pulses.          Dorsalis pedis pulses are 2+ on the right side and 2+ on the left side.       Posterior tibial pulses are 2+ on the right side and 2+ on the left side.     Heart sounds: Normal heart sounds. No murmur heard. Pulmonary:     Effort: Pulmonary effort is normal. No respiratory distress.     Breath sounds: Normal breath sounds.  Chest:  Breasts:    Breasts are symmetrical.  Abdominal:     General: Bowel sounds are normal. There is no distension or abdominal bruit.     Palpations: Abdomen is soft. There is no hepatomegaly, splenomegaly or mass.     Tenderness: There is no abdominal tenderness. There is no right CVA tenderness, left CVA tenderness, guarding or rebound.  Musculoskeletal:        General: Normal range of motion.     Cervical back: Full passive range of motion without pain and neck supple. No tenderness. No spinous process tenderness or muscular tenderness.     Right lower leg: No edema.     Left lower leg: No edema.  Feet:     Right foot:     Toenail Condition: Right toenails are normal.     Left foot:     Toenail Condition: Left toenails are normal.   Lymphadenopathy:     Cervical: No cervical adenopathy.     Upper Body:  Right upper body: No supraclavicular adenopathy.     Left upper body: No supraclavicular adenopathy.  Skin:    General: Skin is warm and dry.     Capillary Refill: Capillary refill takes less than 2 seconds.     Nails: There is no clubbing.  Neurological:     General: No focal deficit present.     Mental Status: He is alert and oriented to person, place, and time.     Cranial Nerves: No cranial nerve deficit.     Sensory: Sensation is intact. No sensory deficit.     Motor: Motor function is intact. No weakness.     Coordination: Coordination is intact. Coordination normal.     Gait: Gait is intact. Gait normal.  Psychiatric:        Attention and Perception: Attention normal.        Mood and Affect: Mood normal.        Speech: Speech normal.        Behavior: Behavior normal. Behavior is cooperative.        Cognition and Memory: Cognition and memory normal.         Assessment & Plan:   Assessment & Plan Encounter for annual physical exam CPE completed today. Review of HM activities and recommendations discussed and provided on AVS. Anticipatory guidance, diet, and exercise recommendations provided. Medications, allergies, and hx reviewed and updated as necessary. Orders placed as listed below.  Plan: - Labs ordered. Will make changes as necessary based on results.  - I will review these results and send recommendations via MyChart or a telephone call.  - F/U with CPE in 1 year or sooner for acute/chronic health needs as directed.      Type 2 diabetes mellitus with other specified complication, without long-term current use of insulin  (HCC) Well controlled on farxiga  daily. No recent BG levels. Will monitor labs today for full evaluation. Recommend continue current regimen.   Orders:   CBC with Differential/Platelet   CMP14+EGFR   Hemoglobin A1c   Lipid panel   TSH   Vitamin B12    Microalbumin/Creatinine Ratio, Urine   HM Diabetes Foot Exam   T4, free   atorvastatin  (LIPITOR) 20 MG tablet; Take 1 tablet (20 mg total) by mouth daily.   dapagliflozin  propanediol (FARXIGA ) 5 MG TABS tablet; TAKE 1 TABLET BY MOUTH EVERY DAY BEFORE BREAKFAST  Hypertension associated with diabetes (HCC) BP well controlled at 122/82 today. Currently managed with amlodipine  and valsartan  with no concerns at this time. Discussed smoking cessation. He is down to 5-8 cigarettes a day and is working on additional cutting back. Patient praised for his efforts. Orders:   CBC with Differential/Platelet   CMP14+EGFR   Hemoglobin A1c   Lipid panel   TSH   Vitamin B12   Microalbumin/Creatinine Ratio, Urine   T4, free   atorvastatin  (LIPITOR) 20 MG tablet; Take 1 tablet (20 mg total) by mouth daily.   dapagliflozin  propanediol (FARXIGA ) 5 MG TABS tablet; TAKE 1 TABLET BY MOUTH EVERY DAY BEFORE BREAKFAST   amLODipine  (NORVASC ) 10 MG tablet; Take 1 tablet (10 mg total) by mouth daily.  Hyperlipidemia associated with type 2 diabetes mellitus (HCC) Chronic. Controlled with atorvastatin  20mg  daily. Labs pending for evaluation.  Orders:   CBC with Differential/Platelet   CMP14+EGFR   Hemoglobin A1c   Lipid panel   TSH   Vitamin B12   Microalbumin/Creatinine Ratio, Urine   T4, free   atorvastatin  (LIPITOR) 20 MG tablet; Take 1  tablet (20 mg total) by mouth daily.   valsartan  (DIOVAN ) 80 MG tablet; Take 1 tablet (80 mg total) by mouth daily.  Elevated serum creatinine Repeat evaluation today.  Orders:   atorvastatin  (LIPITOR) 20 MG tablet; Take 1 tablet (20 mg total) by mouth daily.   dapagliflozin  propanediol (FARXIGA ) 5 MG TABS tablet; TAKE 1 TABLET BY MOUTH EVERY DAY BEFORE BREAKFAST  Vitamin D  deficiency  Orders:   CBC with Differential/Platelet   CMP14+EGFR   Hemoglobin A1c   Lipid panel   TSH   Vitamin B12   Microalbumin/Creatinine Ratio, Urine   atorvastatin  (LIPITOR) 20 MG  tablet; Take 1 tablet (20 mg total) by mouth daily.  Class 1 obesity due to excess calories with serious comorbidity and body mass index (BMI) of 33.0 to 33.9 in adult MI 33.12 today. Patient is active at work with frequent walking. No structured exercise. Recommend purposeful daily activity to get the heart rate up for a sustained 20 minutes to help with overall health, blood sugar, blood pressure, cholesterol, and weight.  Orders:   CBC with Differential/Platelet   CMP14+EGFR   Hemoglobin A1c   Lipid panel   TSH   Vitamin B12   Microalbumin/Creatinine Ratio, Urine   T4, free  Sleep apnea, unspecified type Previously diagnosed with sleep apnea, improved with CPAP. He has not used a CPAP in several years, but reports snoring again. Recommend repeat evaluation to ensure that OSA remains well controlled. . - Sent referral for sleep study. Orders:   Ambulatory referral to Neurology  Screening for endocrine, nutritional, metabolic and immunity disorder  Orders:   CBC with Differential/Platelet   CMP14+EGFR   Hemoglobin A1c   Lipid panel   TSH   Vitamin B12   Microalbumin/Creatinine Ratio, Urine   T4, free  Screening for prostate cancer Age 19 with African American ancestry. Recommend PSA monitoring due to increased risks  Orders:   PSA Total (Reflex To Free)  NEXT PREVENTATIVE PHYSICAL DUE IN 1 YEAR.    PATIENT COUNSELING PROVIDED FOR ALL ADULT PATIENTS: A well balanced diet low in saturated fats, cholesterol, and moderation in carbohydrates.  This can be as simple as monitoring portion sizes and cutting back on sugary beverages such as soda and juice to start with.    Daily water consumption of at least 64 ounces.  Physical activity at least 180 minutes per week.  If just starting out, start 10 minutes a day and work your way up.   This can be as simple as taking the stairs instead of the elevator and walking 2-3 laps around the office  purposefully every day.   STD  protection, partner selection, and regular testing if high risk.  Limited consumption of alcoholic beverages if alcohol  is consumed. For men, I recommend no more than 14 alcoholic beverages per week, spread out throughout the week (max 2 per day). Avoid binge drinking or consuming large quantities of alcohol  in one setting.  Please let me know if you feel you may need help with reduction or quitting alcohol  consumption.   Avoidance of nicotine, if used. Please let me know if you feel you may need help with reduction or quitting nicotine use.   Daily mental health attention. This can be in the form of 5 minute daily meditation, prayer, journaling, yoga, reflection, etc.  Purposeful attention to your emotions and mental state can significantly improve your overall wellbeing  and  Health.  Please know that I am here to help you with  all of your health care goals and am happy to work with you to find a solution that works best for you.  The greatest advice I have received with any changes in life are to take it one step at a time, that even means if all you can focus on is the next 60 seconds, then do that and celebrate your victories.  With any changes in life, you will have set backs, and that is OK. The important thing to remember is, if you have a set back, it is not a failure, it is an opportunity to try again! Screening Testing Mammogram Every 1 -2 years based on history and risk factors Starting at age 63 Pap Smear Ages 21-39 every 3 years Ages 57-65 every 5 years with HPV testing More frequent testing may be required based on results and history Colon Cancer Screening Every 1-10 years based on test performed, risk factors, and history Starting at age 45 Bone Density Screening Every 2-10 years based on history Starting at age 8 for women Recommendations for men differ based on medication usage, history, and risk factors AAA Screening One time ultrasound Men 65-57 years old who  have every smoked Lung Cancer Screening Low Dose Lung CT every 12 months Age 42-80 years with a 30 pack-year smoking history who still smoke or who have quit within the last 15 years   Screening Labs Routine  Labs: Complete Blood Count (CBC), Complete Metabolic Panel (CMP), Cholesterol (Lipid Panel) Every 6-12 months based on history and medications May be recommended more frequently based on current conditions or previous results Hemoglobin A1c Lab Every 3-12 months based on history and previous results Starting at age 88 or earlier with diagnosis of diabetes, high cholesterol, BMI >26, and/or risk factors Frequent monitoring for patients with diabetes to ensure blood sugar control Thyroid Panel (TSH) Every 6 months based on history, symptoms, and risk factors May be repeated more often if on medication HIV One time testing for all patients 38 and older May be repeated more frequently for patients with increased risk factors or exposure Hepatitis C One time testing for all patients 4 and older May be repeated more frequently for patients with increased risk factors or exposure Gonorrhea, Chlamydia Every 12 months for all sexually active persons 13-24 years Additional monitoring may be recommended for those who are considered high risk or who have symptoms Every 12 months for any woman on birth control, regardless of sexual activity PSA Men 19-60 years old with risk factors Additional screening may be recommended from age 3-69 based on risk factors, symptoms, and history  Vaccine Recommendations Tetanus Booster All adults every 10 years Flu Vaccine All patients 6 months and older every year COVID Vaccine All patients 12 years and older Initial dosing with booster May recommend additional booster based on age and health history HPV Vaccine 2 doses all patients age 51-26 Dosing may be considered for patients over 26 Shingles Vaccine (Shingrix) 2 doses all adults 55 years  and older Pneumonia (Pneumovax 47) All adults 65 years and older May recommend earlier dosing based on health history One year apart from Prevnar 77 Pneumonia (Prevnar 76) All adults 65 years and older Dosed 1 year after Pneumovax 23 Pneumonia (Prevnar 20) One time alternative to the two dosing of 13 and 23 For all adults with initial dose of 23, 20 is recommended 1 year later For all adults with initial dose of 13, 23 is still recommended as second option 1 year later

## 2024-05-05 NOTE — Patient Instructions (Addendum)
 Patient Visit Summary VISIT SUMMARY:  You came in today for your annual physical exam.   YOUR PLAN:  -TYPE 2 DIABETES MELLITUS WITH HYPERTENSION AND HYPERLIPIDEMIA: Type 2 diabetes is a condition where your body does not use insulin  properly, leading to high blood sugar levels. Hypertension is high blood pressure, and hyperlipidemia is high cholesterol. Your blood pressure is well-controlled, and you are continuing with your current medications, Farxiga  and valsartan . You are encouraged to regularly monitor your blood sugar levels.  -OBESITY, CLASS 1: Class 1 obesity means your BMI is between 30 and 34.9. You are doing well by engaging in regular walking for exercise, which helps with weight management and cardiovascular health. Continue with your regular walking routine.  -SLEEP APNEA: Sleep apnea is a condition where you have pauses in breathing or shallow breaths during sleep. You have been referred for a sleep study to reassess your condition since you have started snoring again.  -ELEVATED SERUM CREATININE: Elevated serum creatinine can indicate kidney issues. We will continue to monitor this condition.  -GENERAL HEALTH MAINTENANCE: We have ordered B12 and thyroid labs as requested. You have no concerns with hearing or vision, no family history of prostate issues, and no bowel concerns. Continue with your regular walking for exercise.  FORGETFULNESS: We will check your labs and recheck the sleep apnea. Both lab abnormalities and sleep apnea can contribute to these concerns. If these are fine, we can discuss other options.   INSTRUCTIONS:  Please follow up with the sleep study referral and ensure you get your B12 and thyroid labs done. Continue taking your medications as prescribed and monitor your blood sugar levels regularly. Keep up with your regular walking exercise. If you have any new symptoms or concerns, please schedule an appointment.

## 2024-05-05 NOTE — Assessment & Plan Note (Deleted)
" °  Orders:   CBC with Differential/Platelet   CMP14+EGFR   Hemoglobin A1c   Lipid panel   TSH   Vitamin B12   Microalbumin/Creatinine Ratio, Urine   HM Diabetes Foot Exam   T4, free  "

## 2024-05-05 NOTE — Assessment & Plan Note (Addendum)
 Repeat evaluation today.  Orders:   atorvastatin  (LIPITOR) 20 MG tablet; Take 1 tablet (20 mg total) by mouth daily.   dapagliflozin  propanediol (FARXIGA ) 5 MG TABS tablet; TAKE 1 TABLET BY MOUTH EVERY DAY BEFORE BREAKFAST

## 2024-05-05 NOTE — Assessment & Plan Note (Addendum)
" °  Orders:   CBC with Differential/Platelet   CMP14+EGFR   Hemoglobin A1c   Lipid panel   TSH   Vitamin B12   Microalbumin/Creatinine Ratio, Urine   atorvastatin  (LIPITOR) 20 MG tablet; Take 1 tablet (20 mg total) by mouth daily.  "

## 2024-05-05 NOTE — Assessment & Plan Note (Addendum)
 Well controlled on farxiga  daily. No recent BG levels. Will monitor labs today for full evaluation. Recommend continue current regimen.   Orders:   CBC with Differential/Platelet   CMP14+EGFR   Hemoglobin A1c   Lipid panel   TSH   Vitamin B12   Microalbumin/Creatinine Ratio, Urine   HM Diabetes Foot Exam   T4, free   atorvastatin  (LIPITOR) 20 MG tablet; Take 1 tablet (20 mg total) by mouth daily.   dapagliflozin  propanediol (FARXIGA ) 5 MG TABS tablet; TAKE 1 TABLET BY MOUTH EVERY DAY BEFORE BREAKFAST

## 2024-05-05 NOTE — Assessment & Plan Note (Addendum)
 Chronic. Controlled with atorvastatin  20mg  daily. Labs pending for evaluation.  Orders:   CBC with Differential/Platelet   CMP14+EGFR   Hemoglobin A1c   Lipid panel   TSH   Vitamin B12   Microalbumin/Creatinine Ratio, Urine   T4, free   atorvastatin  (LIPITOR) 20 MG tablet; Take 1 tablet (20 mg total) by mouth daily.   valsartan  (DIOVAN ) 80 MG tablet; Take 1 tablet (80 mg total) by mouth daily.

## 2024-05-05 NOTE — Assessment & Plan Note (Addendum)

## 2024-05-05 NOTE — Assessment & Plan Note (Addendum)
 BP well controlled at 122/82 today. Currently managed with amlodipine  and valsartan  with no concerns at this time. Discussed smoking cessation. He is down to 5-8 cigarettes a day and is working on additional cutting back. Patient praised for his efforts. Orders:   CBC with Differential/Platelet   CMP14+EGFR   Hemoglobin A1c   Lipid panel   TSH   Vitamin B12   Microalbumin/Creatinine Ratio, Urine   T4, free   atorvastatin  (LIPITOR) 20 MG tablet; Take 1 tablet (20 mg total) by mouth daily.   dapagliflozin  propanediol (FARXIGA ) 5 MG TABS tablet; TAKE 1 TABLET BY MOUTH EVERY DAY BEFORE BREAKFAST   amLODipine  (NORVASC ) 10 MG tablet; Take 1 tablet (10 mg total) by mouth daily.

## 2024-05-05 NOTE — Assessment & Plan Note (Addendum)
 Previously diagnosed with sleep apnea, improved with CPAP. He has not used a CPAP in several years, but reports snoring again. Recommend repeat evaluation to ensure that OSA remains well controlled. . - Sent referral for sleep study. Orders:   Ambulatory referral to Neurology

## 2024-05-06 LAB — LIPID PANEL
Chol/HDL Ratio: 3.2 ratio (ref 0.0–5.0)
Cholesterol, Total: 174 mg/dL (ref 100–199)
HDL: 55 mg/dL
LDL Chol Calc (NIH): 101 mg/dL — ABNORMAL HIGH (ref 0–99)
Triglycerides: 100 mg/dL (ref 0–149)
VLDL Cholesterol Cal: 18 mg/dL (ref 5–40)

## 2024-05-06 LAB — MICROALBUMIN / CREATININE URINE RATIO
Creatinine, Urine: 114.5 mg/dL
Microalb/Creat Ratio: 4 mg/g{creat} (ref 0–29)
Microalbumin, Urine: 4.7 ug/mL

## 2024-05-06 LAB — CBC WITH DIFFERENTIAL/PLATELET
Basophils Absolute: 0 x10E3/uL (ref 0.0–0.2)
Basos: 0 %
EOS (ABSOLUTE): 0.1 x10E3/uL (ref 0.0–0.4)
Eos: 2 %
Hematocrit: 47.6 % (ref 37.5–51.0)
Hemoglobin: 15.6 g/dL (ref 13.0–17.7)
Immature Grans (Abs): 0 x10E3/uL (ref 0.0–0.1)
Immature Granulocytes: 0 %
Lymphocytes Absolute: 2 x10E3/uL (ref 0.7–3.1)
Lymphs: 25 %
MCH: 29.4 pg (ref 26.6–33.0)
MCHC: 32.8 g/dL (ref 31.5–35.7)
MCV: 90 fL (ref 79–97)
Monocytes Absolute: 0.8 x10E3/uL (ref 0.1–0.9)
Monocytes: 9 %
Neutrophils Absolute: 5 x10E3/uL (ref 1.4–7.0)
Neutrophils: 64 %
Platelets: 162 x10E3/uL (ref 150–450)
RBC: 5.31 x10E6/uL (ref 4.14–5.80)
RDW: 12.5 % (ref 11.6–15.4)
WBC: 8 x10E3/uL (ref 3.4–10.8)

## 2024-05-06 LAB — CMP14+EGFR
ALT: 22 IU/L (ref 0–44)
AST: 19 IU/L (ref 0–40)
Albumin: 4 g/dL (ref 3.8–4.9)
Alkaline Phosphatase: 72 IU/L (ref 47–123)
BUN/Creatinine Ratio: 13 (ref 9–20)
BUN: 12 mg/dL (ref 6–24)
Bilirubin Total: 0.5 mg/dL (ref 0.0–1.2)
CO2: 20 mmol/L (ref 20–29)
Calcium: 9.5 mg/dL (ref 8.7–10.2)
Chloride: 105 mmol/L (ref 96–106)
Creatinine, Ser: 0.96 mg/dL (ref 0.76–1.27)
Globulin, Total: 2.8 g/dL (ref 1.5–4.5)
Glucose: 80 mg/dL (ref 70–99)
Potassium: 4.1 mmol/L (ref 3.5–5.2)
Sodium: 141 mmol/L (ref 134–144)
Total Protein: 6.8 g/dL (ref 6.0–8.5)
eGFR: 92 mL/min/1.73

## 2024-05-06 LAB — TSH: TSH: 1.02 u[IU]/mL (ref 0.450–4.500)

## 2024-05-06 LAB — PSA TOTAL (REFLEX TO FREE): Prostate Specific Ag, Serum: 1.1 ng/mL (ref 0.0–4.0)

## 2024-05-06 LAB — T4, FREE: Free T4: 1.24 ng/dL (ref 0.82–1.77)

## 2024-05-06 LAB — VITAMIN B12: Vitamin B-12: 976 pg/mL (ref 232–1245)

## 2024-05-06 LAB — HEMOGLOBIN A1C
Est. average glucose Bld gHb Est-mCnc: 123 mg/dL
Hgb A1c MFr Bld: 5.9 % — ABNORMAL HIGH (ref 4.8–5.6)

## 2024-05-12 ENCOUNTER — Ambulatory Visit: Payer: Self-pay | Admitting: Nurse Practitioner

## 2024-05-12 DIAGNOSIS — E1159 Type 2 diabetes mellitus with other circulatory complications: Secondary | ICD-10-CM

## 2024-05-12 DIAGNOSIS — R7989 Other specified abnormal findings of blood chemistry: Secondary | ICD-10-CM

## 2024-05-12 DIAGNOSIS — E559 Vitamin D deficiency, unspecified: Secondary | ICD-10-CM

## 2024-05-12 DIAGNOSIS — E1169 Type 2 diabetes mellitus with other specified complication: Secondary | ICD-10-CM

## 2024-05-12 MED ORDER — ATORVASTATIN CALCIUM 40 MG PO TABS: 40.0000 mg | ORAL_TABLET | Freq: Every day | ORAL | 3 refills | Status: AC
